# Patient Record
Sex: Female | Born: 1980 | Race: Asian | Hispanic: No | Marital: Married | Smoking: Never smoker
Health system: Southern US, Community
[De-identification: ages and names within clinical notes are randomized; demographics above are authoritative.]

## PROBLEM LIST (undated history)

## (undated) ENCOUNTER — Inpatient Hospital Stay (HOSPITAL_COMMUNITY): Payer: Self-pay

## (undated) DIAGNOSIS — F329 Major depressive disorder, single episode, unspecified: Secondary | ICD-10-CM

## (undated) DIAGNOSIS — O26619 Liver and biliary tract disorders in pregnancy, unspecified trimester: Secondary | ICD-10-CM

## (undated) DIAGNOSIS — O24419 Gestational diabetes mellitus in pregnancy, unspecified control: Secondary | ICD-10-CM

## (undated) DIAGNOSIS — O26649 Intrahepatic cholestasis of pregnancy, unspecified trimester: Secondary | ICD-10-CM

## (undated) DIAGNOSIS — K831 Obstruction of bile duct: Secondary | ICD-10-CM

## (undated) DIAGNOSIS — Z789 Other specified health status: Secondary | ICD-10-CM

## (undated) HISTORY — PX: NO PAST SURGERIES: SHX2092

## (undated) HISTORY — DX: Gestational diabetes mellitus in pregnancy, unspecified control: O24.419

## (undated) HISTORY — DX: Other specified health status: Z78.9

## (undated) HISTORY — DX: Major depressive disorder, single episode, unspecified: F32.9

---

## 2010-10-06 ENCOUNTER — Other Ambulatory Visit: Payer: Self-pay

## 2010-10-06 DIAGNOSIS — Z331 Pregnant state, incidental: Secondary | ICD-10-CM

## 2010-10-06 NOTE — Progress Notes (Signed)
Prenatal labs done [facility] Naina Sleeper

## 2010-10-07 LAB — OBSTETRIC PANEL
Eosinophils Absolute: 0.2 10*3/uL (ref 0.0–0.7)
Eosinophils Relative: 2 % (ref 0–5)
Hemoglobin: 12.8 g/dL (ref 12.0–15.0)
Hepatitis B Surface Ag: NEGATIVE
Lymphocytes Relative: 29 % (ref 12–46)
Lymphs Abs: 2.7 10*3/uL (ref 0.7–4.0)
MCH: 28.4 pg (ref 26.0–34.0)
MCV: 88.2 fL (ref 78.0–100.0)
Monocytes Relative: 5 % (ref 3–12)
Platelets: 256 10*3/uL (ref 150–400)
RBC: 4.51 MIL/uL (ref 3.87–5.11)
Rh Type: POSITIVE
Rubella: 163.3 IU/mL — ABNORMAL HIGH
WBC: 9.5 10*3/uL (ref 4.0–10.5)

## 2010-10-07 LAB — SICKLE CELL SCREEN: Sickle Cell Screen: NEGATIVE

## 2010-10-08 LAB — CULTURE, OB URINE

## 2010-10-13 ENCOUNTER — Encounter: Payer: Self-pay | Admitting: Family Medicine

## 2010-10-15 ENCOUNTER — Ambulatory Visit (INDEPENDENT_AMBULATORY_CARE_PROVIDER_SITE_OTHER): Payer: Self-pay | Admitting: Family Medicine

## 2010-10-15 ENCOUNTER — Encounter: Payer: Self-pay | Admitting: Family Medicine

## 2010-10-15 DIAGNOSIS — Z3480 Encounter for supervision of other normal pregnancy, unspecified trimester: Secondary | ICD-10-CM

## 2010-10-15 NOTE — Progress Notes (Signed)
Pt is a 30 y.o. G1P0 at 11.2 weeks based on LMP of 07/28/10-  Pt states that menstrual cycles are reliable.  S: Pt states that her only concern is that she is anxious about the pelvic exam and wasn't prepared to have this done today.  She has never had a pelvic exam previously. Asks if we can defer this to next appt.  O: see above. Lungs- CTA bilateral, good air movement Heart- RRR, no murmur, rubs or gallops Ext- no edema abd- unable to obtain FHR  A and P:  1) Unable to obtain FHR with dopplar at today's exam.  Will have pt return in 2 weeks for recheck.  2) Pt also needs to have recheck of u/a and culture since the results came back that it was most likely contaminated.  Will recheck this in 2 weeks as well.  3)pelvic exam:She will return in 2 weeks for this exam.  Refused to have this done today.   Explained the pelvic exam and showed pt speculum and the reason for the tests that we do.   4)reviewed prenatal labs- other than urine as stated above- all wnl.

## 2010-10-15 NOTE — Patient Instructions (Signed)
Set up an appointment for in 2 weeks for recheck to see if we can hear the baby's heart beat.  Also at your 2 week appointment we will do the pelvic exam.  If any questions or concerns call the office.   Talk to deborah hill about orange card so I can order u/s if it is needed. Tylenol as needed for back pain. It was so great to meet you today!  Please let me know if any questions.

## 2010-10-16 ENCOUNTER — Ambulatory Visit (INDEPENDENT_AMBULATORY_CARE_PROVIDER_SITE_OTHER): Payer: Self-pay | Admitting: Family Medicine

## 2010-10-16 VITALS — BP 101/65 | Wt 135.0 lb

## 2010-10-16 DIAGNOSIS — O3680X0 Pregnancy with inconclusive fetal viability, not applicable or unspecified: Secondary | ICD-10-CM

## 2010-10-16 NOTE — Progress Notes (Signed)
S: Pt here today for an U/S as FHTs could not be attained at last visit with Dr. Edmonia James. Pt is [redacted]w[redacted]d today, if using EDD of 11/5, which is based on LMP. Pt applying for orange card and does not want to get a dating U/S until this has been approved because of the expense. O: BP 101/65, Wt 135 No FHTs heard via doppler.  No yolk sac or fetus viewable by clinic U/S  A/P: WIll draw B-hCG today and have pt come back on Monday for another lab draw to trend values.  Offered pt to get U/S at [facility] this week, however pt would like to wait until she has [employer], so suggested getting [employer] ASAP so that better U/S can be done at [employer] for dating/viability of pregnancy/ rule out ectopic. [facility] or [facility]. Pt advised of possible options of not hearing FHTs or seeing fetus on U/S include inaccurate dating (pt earlier than stated dates) vs. Ectopic pregnancy vs. Missed SAB.

## 2010-10-16 NOTE — Progress Notes (Addendum)
Note reviewed.  Pt actually eligible for group care, but did not realize appt was this week. She will try to come today, but otherwise she states she is unable to return to clinic until May 15th. It is concerning that we cannot hear FHT at 11 weeks on this pt.  We will try to have her return in the next week or so if she cannot come today so we can retry the doppler and our ultrasound.  If no fetal heart beat is visualized, then we will need to schedule ultrasound at [facility], even if she does not have orange card.  Pt agrees with the plan. Labs reviewed.  Pelvic exam and pap, gc/cz to be completed when pt returns.

## 2010-10-16 NOTE — Patient Instructions (Signed)
I am hopeful that everything will be fine with the pregnancy. If you have bleeding or bad pain over the weekend, then please go to [facility] to be seen. Please make an appointment with the lab for Monday.

## 2010-10-20 ENCOUNTER — Other Ambulatory Visit: Payer: Self-pay

## 2010-10-20 DIAGNOSIS — O3680X0 Pregnancy with inconclusive fetal viability, not applicable or unspecified: Secondary | ICD-10-CM

## 2010-10-20 NOTE — Progress Notes (Signed)
B-HCG Jacqueline Bond

## 2010-10-21 ENCOUNTER — Telehealth: Payer: Self-pay | Admitting: Family Medicine

## 2010-10-21 DIAGNOSIS — O200 Threatened abortion: Secondary | ICD-10-CM | POA: Insufficient documentation

## 2010-10-21 LAB — HCG, QUANTITATIVE, PREGNANCY: hCG, Beta Chain, Quant, S: 30445.2 m[IU]/mL

## 2010-10-21 NOTE — Telephone Encounter (Signed)
Attempted to call pt about lab results.  Call went to VM.  No message left.  If pt calls back today, feel free to page me, even if it is the afternoon and I am not here. [phone]. Her lab results are concerning for a non-viable pregnancy.  We need an ultrasound to confirm.  I placed an order for this, but definitely want to talk with the pt before we tell her about the ultrasound appt.

## 2010-10-21 NOTE — Telephone Encounter (Signed)
Again attempted to call pt.  No answer.

## 2010-10-22 ENCOUNTER — Telehealth: Payer: Self-pay | Admitting: Family Medicine

## 2010-10-22 NOTE — Telephone Encounter (Signed)
Spoke with D. [place] and she states that patient does not want to get [place]

## 2010-10-22 NOTE — Telephone Encounter (Signed)
Spoke with pt.  She was aware of Thrusday's b hcg level, and we discussed Monday's level, which was lower.  Ms. Smeltz has spoken with several physicians in her family, and they have advised her to just wait.  Her husband is very busy with finals now.  She is concerned about the pregnancy, and we discussed that the decrease in her b-hcg is very concerning, and we discussed the possibility of ectopic pregnancy, which could lead to internal bleeding and rarely, death of the pregnant woman.  Also, we discussed the possiblity ofmaternal  infection if this is a missed AB.  Ms. Koning denies any cramping, bleeding or pain or fevers.  She states understanding of the risk of waiting, and prefers to wait.  She declines an ultrasound at this time.  She knows to go the emergency room at [facility] if she has any bleeding, fevers, cramping or anything else concerning.  She will contact us with any questions.

## 2010-11-09 ENCOUNTER — Telehealth: Payer: Self-pay | Admitting: Family Medicine

## 2010-11-09 NOTE — Telephone Encounter (Signed)
Patient is [redacted] weeks pregnant.  Noticed several episodes of spotting today when she went to the restroom.   No abdominal pain.  No nausea or vomiting.  Had intercourse on Friday, which was somewhat painful for her.   Recommended she go be seen at [facility].  Patient hesitant as she has appt on Tuesday with Dawn, but again I reiterated that she needed to be seen before then.  Patient agreed with plan.

## 2010-11-10 ENCOUNTER — Ambulatory Visit (HOSPITAL_COMMUNITY)
Admission: RE | Admit: 2010-11-10 | Discharge: 2010-11-10 | Disposition: A | Discharge status: Home / Self Care | Payer: Self-pay | Source: Ambulatory Visit | Attending: Family Medicine | Admitting: Family Medicine

## 2010-11-10 ENCOUNTER — Other Ambulatory Visit: Payer: Self-pay | Admitting: Family Medicine

## 2010-11-10 ENCOUNTER — Ambulatory Visit (INDEPENDENT_AMBULATORY_CARE_PROVIDER_SITE_OTHER): Payer: Self-pay | Admitting: Family Medicine

## 2010-11-10 ENCOUNTER — Encounter: Payer: Self-pay | Admitting: Family Medicine

## 2010-11-10 DIAGNOSIS — O200 Threatened abortion: Secondary | ICD-10-CM

## 2010-11-10 DIAGNOSIS — O368390 Maternal care for abnormalities of the fetal heart rate or rhythm, unspecified trimester, not applicable or unspecified: Secondary | ICD-10-CM | POA: Insufficient documentation

## 2010-11-10 DIAGNOSIS — O209 Hemorrhage in early pregnancy, unspecified: Secondary | ICD-10-CM | POA: Insufficient documentation

## 2010-11-10 DIAGNOSIS — Z3689 Encounter for other specified antenatal screening: Secondary | ICD-10-CM | POA: Insufficient documentation

## 2010-11-10 NOTE — Progress Notes (Signed)
  Subjective:    Patient ID: Jacqueline Bond, female    DOB: 04/17/1981, 30 y.o.   MRN: [id number]  HPI  1. Vaginal Bleed: G1P0 at 15 weeks per LMP presenting with scant vaginal bleeding x 3 days after sexual intercourse. Hx reviewed. Was seen on 4/19 - no FHT heard (11.[redacted] weeks GA). HCG = [id number]. Taken again on 4/23 = [id number]. Patient informed of need for [place] but did not go 2/2 lack of insurance. "I made my decision to wait." Taking PNV.   Review of Systems SEE HPI. No abdominal pain, vaginal DC, fever/chills, N/V/D, back pain, dizziness.   Objective:   Physical Exam  Vitals reviewed. Constitutional: She appears well-developed and well-nourished. No distress.  Neck: No thyromegaly present.  Abdominal: Soft. Bowel sounds are normal. She exhibits no distension and no mass. There is no tenderness. There is no rebound and no guarding.  Genitourinary: Uterus normal. Vaginal discharge found.       Blood and mucous (? POC) but unclear. No s/s of infection. No samples taken. No cervical motion tenderness. No mass palpated on ovaries. [place] - No FH visualized, no fetus visualized either. NOTE: This was patient's first pelvic and she was very nervous/tense, making exam difficult.     Assessment & Plan:

## 2010-11-10 NOTE — Patient Instructions (Signed)
We are checking labs today. We are sending you for an ultrasound. Please keep your appointment with Dr. Edmonia James.

## 2010-11-10 NOTE — Assessment & Plan Note (Signed)
Discussed importance of immediate [place]. Also ordered BHCG. Patient endorsed understanding and was sent directly to [facility]. US showed blighted ovum. I called the [facility] and spoke with Dr. Shawnie Pons re: further intervention (D&C vs medication). The OB/GYN staff were already aware of the patient and discussing options with the patient. I thanked Dr. Shawnie Pons and told her to let us know if she needed anything else from [place]. NOTE: We have no documented blood type.

## 2010-11-11 ENCOUNTER — Telehealth: Payer: Self-pay | Admitting: Family Medicine

## 2010-11-11 ENCOUNTER — Ambulatory Visit (INDEPENDENT_AMBULATORY_CARE_PROVIDER_SITE_OTHER): Payer: Self-pay | Admitting: Family Medicine

## 2010-11-11 DIAGNOSIS — O200 Threatened abortion: Secondary | ICD-10-CM

## 2010-11-11 LAB — HCG, QUANTITATIVE, PREGNANCY: hCG, Beta Chain, Quant, S: 5015.5 m[IU]/mL

## 2010-11-11 NOTE — Patient Instructions (Signed)
Place 4 tablets of cytotec in vagina x 1 to help the uterus contract and clean out the uterus. You will have vaginal bleeding and cramping. Take vicodin as needed for pain. If you have fever, or if you are bleeding so much that you need to change your pad every hour please go to the emergency room at [facility] hospital.  Return in 2 weeks for recheck.

## 2010-11-11 NOTE — Progress Notes (Signed)
  Subjective:    Patient ID: Jacqueline Bond, female    DOB: [birth year], 30 y.o.   MRN: [id number]  HPI Requested that pt return to see me today after my converstation with Attending Dr. Penne Lash.   Dr. Penne Lash recommended that pt be given cytotec and if she failed medical management to go forward possibly with D and C.  Called pt to discuss over phone but pt and husband had a lot of questions, and since pt's second language is english I thought that it would be best to have pt return for a work in appt to discuss in detail the medical management plan.    Review of Systems     Objective:   Physical Exam General: no acute distress.   resp: no resp distress, normal resp rate. Psych: tearful during exam.     Assessment & Plan:  After discussion of medical treatment plan (see pt instructions) pt agrees to medical management as recommended by Dr. Penne Lash.  Pt to take cytotec tonight.   vicodin for pain.  Gave red flags for return.  Pt to return in 2 weeks for recheck urine pregnancy test to ensure that it is negative.

## 2010-11-11 NOTE — Progress Notes (Signed)
S: Pt here to discuss options for anembryonic gestation.  Discussed options with Dr. Natale Milch yesterday.  Here to discuss these options further with me her pcp before making a decision.  Aware that there are 3 options 1) watchful waiting.  2) medication  3) d and c.  Pt states she is leaning toward D and C and is requesting to discuss risks and benefits.  Pt states that she is concerned about how to pay for the d and c since she has not yet talked with deborah hill.    O:   Pulm- no resp distress, symmetric breathing pattern Ext- no lower ext edema Psych- tearful during conversation  A and P: After discussion of pros and cons of anembryonic gestation and review of note from Dr. Natale Milch with pt,  Pt has decided that she would like d and C.  [facility]ap.  Pt is to go talk to deborah hill to discuss financial concerns.

## 2010-11-11 NOTE — Telephone Encounter (Signed)
Patient was seen by Dr Edmonia James this afternoon.Nozomi Mettler, Rodena Medin

## 2010-11-11 NOTE — Telephone Encounter (Signed)
Pt asking to speak with MD re: some complications she is having, did not want to give details.

## 2010-11-11 NOTE — Progress Notes (Signed)
Addended byEllin Mayhew on: 11/11/2010 06:25 PM   Modules accepted: Orders

## 2010-11-13 NOTE — Progress Notes (Signed)
Addended byEllin Mayhew on: 11/13/2010 07:33 AM   Modules accepted: Orders

## 2010-11-17 ENCOUNTER — Encounter: Payer: Self-pay | Admitting: Family Medicine

## 2010-11-20 ENCOUNTER — Encounter: Payer: Self-pay | Admitting: Family Medicine

## 2010-11-20 ENCOUNTER — Ambulatory Visit (INDEPENDENT_AMBULATORY_CARE_PROVIDER_SITE_OTHER): Payer: Self-pay | Admitting: Family Medicine

## 2010-11-20 DIAGNOSIS — O200 Threatened abortion: Secondary | ICD-10-CM

## 2010-11-20 NOTE — Assessment & Plan Note (Addendum)
+   pregnancy test 9 days after medical management with cytotec for anembryonic pregnancy.  Pt returned earlier than instructed for recheck of urine. (8 days versus 2 weeks)  Since bleeding stopped and cramping stopped most likely abortion complete.  Will trend down BHCG to ensure that complete evacuation of pregnancy. Encouraged pt to exercise as she would like.  Pt also told that she can travel to see family.

## 2010-11-20 NOTE — Progress Notes (Signed)
  Subjective:    Patient ID: Jacqueline Bond, female    DOB: 1980-07-10, 30 y.o.   MRN: [id number]  HPI Pt here for f/up s/p medical management with cytotec for anembryonic pregnancy: Pt took cytotec as directed, had severe cramping with bleeding x 12 hours.  Then cramping improved and had spotting x 6 days.  Currently pt states that no further cramping or bleeding.  No fever. No chills.  Pt wants to travel and start exercising and want to know if this is ok to start  Symptoms of depression: Since anembryonic pregnancy pt has had symptoms of depression.  +trouble sleeping. Decrease in interest/ concentration.  Decreased energy.  Normal appetite. No si or hi.     Review of Systems As per above    Objective:   Physical Exam  Constitutional: She is oriented to person, place, and time. She appears well-developed and well-nourished.  Eyes: Right eye exhibits no discharge. Left eye exhibits no discharge.  Cardiovascular: Normal rate.   Pulmonary/Chest: Effort normal and breath sounds normal. No respiratory distress.  Abdominal: Soft. There is no tenderness.  Neurological: She is alert and oriented to person, place, and time.  Skin: No rash noted.  Psychiatric: Her behavior is normal. Judgment and thought content normal.       Some tearfulness during exam/discussion          Assessment & Plan:

## 2010-11-21 ENCOUNTER — Telehealth: Payer: Self-pay | Admitting: Family Medicine

## 2010-11-21 NOTE — Telephone Encounter (Signed)
Pt requesting lab results from visit yesterday

## 2010-11-21 NOTE — Telephone Encounter (Signed)
Will fwd. To PCP 

## 2010-11-22 NOTE — Telephone Encounter (Signed)
Attempt to call pt.  No answer. Message left.

## 2010-11-25 ENCOUNTER — Other Ambulatory Visit: Payer: Self-pay | Admitting: Family Medicine

## 2010-11-25 DIAGNOSIS — O020 Blighted ovum and nonhydatidiform mole: Secondary | ICD-10-CM

## 2010-11-25 NOTE — Telephone Encounter (Signed)
Pt returning MD's call.

## 2010-11-25 NOTE — Telephone Encounter (Signed)
Spoke with pt over telephone to discuss results.  Pt states understanding.  Pt to return on 6/4 for repeat BHCG.  Pt to call for lab appointment.  Future order placed.

## 2010-11-27 ENCOUNTER — Other Ambulatory Visit: Payer: Self-pay

## 2010-12-01 ENCOUNTER — Other Ambulatory Visit: Payer: Self-pay

## 2010-12-01 DIAGNOSIS — O020 Blighted ovum and nonhydatidiform mole: Secondary | ICD-10-CM

## 2010-12-01 LAB — HCG, QUANTITATIVE, PREGNANCY: hCG, Beta Chain, Quant, S: 4 m[IU]/mL

## 2010-12-01 NOTE — Progress Notes (Signed)
b-hcg done today [facility] Oveta Idris

## 2010-12-03 ENCOUNTER — Telehealth: Payer: Self-pay | Admitting: Family Medicine

## 2010-12-03 ENCOUNTER — Encounter: Payer: Self-pay | Admitting: Family Medicine

## 2010-12-03 NOTE — Telephone Encounter (Signed)
I called to notify pt of Quant HCG now down to normal.  No answer.  Left message. Told pt to call back if she would like to talk in more detail.

## 2010-12-03 NOTE — Telephone Encounter (Signed)
Message copied by Kristen Cardinal on Wed Dec 03, 2010  8:40 AM ------      Message from: Zachery Dauer      Created: Tue Dec 02, 2010  8:45 AM      Regarding: See beta Hcg       See result, came to me for some reason

## 2011-08-13 ENCOUNTER — Encounter (HOSPITAL_COMMUNITY): Payer: Self-pay | Admitting: *Deleted

## 2012-11-01 ENCOUNTER — Ambulatory Visit (INDEPENDENT_AMBULATORY_CARE_PROVIDER_SITE_OTHER): Payer: Self-pay | Admitting: Family Medicine

## 2012-11-01 ENCOUNTER — Encounter: Payer: Self-pay | Admitting: Family Medicine

## 2012-11-01 VITALS — BP 102/66 | HR 77 | Ht 62.0 in | Wt 125.7 lb

## 2012-11-01 DIAGNOSIS — N970 Female infertility associated with anovulation: Secondary | ICD-10-CM

## 2012-11-01 DIAGNOSIS — Z3169 Encounter for other general counseling and advice on procreation: Secondary | ICD-10-CM

## 2012-11-01 LAB — COMPREHENSIVE METABOLIC PANEL
AST: 19 U/L (ref 0–37)
Albumin: 4.6 g/dL (ref 3.5–5.2)
BUN: 9 mg/dL (ref 6–23)
CO2: 25 mEq/L (ref 19–32)
Calcium: 10.1 mg/dL (ref 8.4–10.5)
Chloride: 104 mEq/L (ref 96–112)
Creat: 0.68 mg/dL (ref 0.50–1.10)
Potassium: 4.2 mEq/L (ref 3.5–5.3)

## 2012-11-01 LAB — CBC
HCT: 38.2 % (ref 36.0–46.0)
Hemoglobin: 12.8 g/dL (ref 12.0–15.0)
MCV: 85.3 fL (ref 78.0–100.0)
RBC: 4.48 MIL/uL (ref 3.87–5.11)
WBC: 7.9 10*3/uL (ref 4.0–10.5)

## 2012-11-01 NOTE — Patient Instructions (Signed)
I will let you know the results of the test. Also, please keep a record of your period. Please return in approximately 8 weeks to see how the process is progress. In the meantime, please start taking a daily pre-natal vitamin.   Take Care,   Dr. Clinton Sawyer

## 2012-11-01 NOTE — Progress Notes (Signed)
  Subjective:    Patient ID: Jacqueline Bond, female    DOB: 1981-04-05, 32 y.o.   MRN: [id number]  HPI  Accompanied by her husband   Chief Complaint: Infertility  Patient and husband trying to conceive for 6-7 months.  LMP April 13th, lasted 3-4 days; No health changes aside from unintentional weight gain of 10lbs in 3 mos; not taking any medications; Husband denies any changes to his health as well. Neither taking any medication and not with any known chronic diseases.   OB History - Spontaneous in May 2012 at [redacted] weeks EGA due to anembryonic pregnancy; This was treated with cytotec at home. Neither patient nor husband ever had child with other partners and neither with previous work up for infertility.   Past Surgery History - None for patient or husband  Fam Hx - Maternal Aunt with 6 miscarriages   Review of Systems Negative for skin changes, hair loss, cold intolerance, increased hair growth    Objective:   Physical Exam BP 102/66  Pulse 77  Ht 5\' 2"  (1.575 m)  Wt 125 lb 11.2 oz (57.017 kg)  BMI 22.98 kg/m2  LMP 10/09/2012  Breastfeeding? No  Gen: young female, well appearing, NAD, pleasant and conversant HEENT: NCAT, PERRLA, EOMI, OP clear and moist, no lymphadenopathy, no thyroid tenderness, enlargement, or nodules CV: RRR, no m/r/g, no JVD or carotid bruits Pulm: normal WOB, CTA-B Abd: soft, NDNT, NABS Extremities: no edema or joint tenderness Skin: warm, dry, no rashes, no hirsutism       Assessment & Plan:

## 2012-11-02 ENCOUNTER — Encounter: Payer: Self-pay | Admitting: Family Medicine

## 2012-11-02 DIAGNOSIS — Z3169 Encounter for other general counseling and advice on procreation: Secondary | ICD-10-CM | POA: Insufficient documentation

## 2012-11-02 LAB — TSH: TSH: 1.766 u[IU]/mL (ref 0.350–4.500)

## 2012-11-02 NOTE — Assessment & Plan Note (Signed)
Patient and husband told that work up for fertility begins usually after 12 months of trying to conceive. I noted that is appropriate to check basic labs today (CBC, BMET, TSH) on patient, but nothing else is indicated. They are agreeable to this. Will return in 2 months for update. Work up would begin with analysis of female semen.

## 2013-02-09 ENCOUNTER — Ambulatory Visit (INDEPENDENT_AMBULATORY_CARE_PROVIDER_SITE_OTHER): Payer: No Typology Code available for payment source | Admitting: Family Medicine

## 2013-02-09 ENCOUNTER — Encounter: Payer: Self-pay | Admitting: Family Medicine

## 2013-02-09 VITALS — BP 102/70 | HR 73 | Ht 62.0 in | Wt 130.0 lb

## 2013-02-09 DIAGNOSIS — Z3169 Encounter for other general counseling and advice on procreation: Secondary | ICD-10-CM

## 2013-02-09 NOTE — Progress Notes (Signed)
  Subjective:    Patient ID: Jacqueline Bond, female    DOB: [birth year], 32 y.o.   MRN: [id number]  HPI  32 year old F who presents with her husband for eval of difficulty with conceiving a child. The couple was seen in May 2014 and had been trying for 6-7 months without success. It was determined through history that both were healthy, she was having regular menstrual cycles, and there were no known secondary causes of infertility. CBC, BMP, and TSH were all WNL.   Today, the patient notes LMP on 01/29/13 with a regular 27-30 days cycle. She also notes no changes to diet or medication. Same for her husband. They have NOT had regular intercourse, with only once in the last month due to his school and her work schedule. She recognizes that this is an issue. She is also wondering if the cytotec she took for her anembryonic pregnancy in 2012 could have caused a problem. I assured her that it is not causing a problem.       Review of Systems     Objective:   Physical Exam  BP 102/70  Pulse 73  Ht 5\' 2"  (1.575 m)  Wt 130 lb (58.968 kg)  BMI 23.77 kg/m2  LMP 01/29/2013 Gen: well appearing female, normal body habitus, without virilization, pleasant and conversant  GYN exam deferred     Assessment & Plan:  > 15 minutes spent counseling the patient and her husband

## 2013-02-09 NOTE — Patient Instructions (Signed)
Thank you for coming in to see me today. I am glad that you are both still very health and excited about your husband's program. Please do the following things:  1. Have regular sex during the likely time of ovulation. You can get a kit in the pharmacy to help you measure this.  2. Have your husband establish as a patient.  3. Be patient and positive! Take a weekend vacation if needed.   Come back in 2-3 months.   Sincerely,  Dr. Clinton Sawyer

## 2013-02-10 NOTE — Assessment & Plan Note (Signed)
Assessment: Healthy well appearing patient concerning for difficulty conceiving. No concerning PMH, PSH, medications, or recreational drug use. According to her husband, he also does not have any noteworthy  PMH, PSH, medications, or recreational drug use. According to her husband, he also does not have any noteworthy. Previous work-up included basic lab work with CBC, CMP, and TSH in May 2014. All of these were normal. The couple has not been having regular intercourse, so there is no utility in progressing with a work-up until this occurs.  Plan: I counseled the couple on need for regular intercourse during the time of ovulation. I also discussed the most likely time in a cycle when she would be ovulating. The patient will obtain an OTC ovulation kit and plan for more regular sex. She will continue pre-natal vitamins. The husband will also register as a patient of mine. The first step in a true infertility work-up would be a GYN exam of the patient and genital exam of her husband. He would also need a semen analysis.

## 2013-05-03 ENCOUNTER — Ambulatory Visit: Payer: No Typology Code available for payment source | Admitting: Family Medicine

## 2013-05-10 ENCOUNTER — Ambulatory Visit (INDEPENDENT_AMBULATORY_CARE_PROVIDER_SITE_OTHER): Payer: No Typology Code available for payment source | Admitting: Family Medicine

## 2013-05-10 ENCOUNTER — Encounter: Payer: Self-pay | Admitting: Family Medicine

## 2013-05-10 ENCOUNTER — Ambulatory Visit: Payer: No Typology Code available for payment source

## 2013-05-10 VITALS — BP 101/70 | HR 83 | Temp 98.2°F | Ht 62.0 in | Wt 134.0 lb

## 2013-05-10 DIAGNOSIS — Z23 Encounter for immunization: Secondary | ICD-10-CM

## 2013-05-10 DIAGNOSIS — N926 Irregular menstruation, unspecified: Secondary | ICD-10-CM

## 2013-05-10 DIAGNOSIS — Z3201 Encounter for pregnancy test, result positive: Secondary | ICD-10-CM

## 2013-05-10 NOTE — Assessment & Plan Note (Signed)
Assessment: positive pregnancy test, per LMP [redacted]w[redacted]d EGA Plan:  - Pt counseled about taking prenatal vitamins and avoiding potentially harmful substances during pregnancy - Counseled on tx for nausea - Will make referral to our OB coordinator to set up initial OB visit

## 2013-05-10 NOTE — Patient Instructions (Signed)
Dear Jacqueline Bond,   It was great to see you. I am excited that you are pregnant. Please do the following things:  1. Take a prenatal vitamin daily. This is important for brain development of the baby.  2. Avoid cigarettes, alcohol, tobacco, raw meats and cheeses, deli meats, and cat litter.  3. For nausea you can try Unisom, ginger tablets, or vitamin B6 tablets. All are sold in the pharmacy without a prescription.   I will contact our nurse coordinator to set up your prenatal care, and she will contact you to set up an appointment. Please sooner return if you have any bleeding.   Sincerely,   Dr. Clinton Sawyer

## 2013-05-10 NOTE — Progress Notes (Signed)
  Subjective:    Patient ID: Jacqueline Bond, female    DOB: Oct 09, 1980, 32 y.o.   MRN: [id number]  HPI  Chief Complaint: Difficulty with conception   Patient and husband trying to conceive for months. Work up today revealed normal CBC, CMP, and TSH as well as non-revealing history. Patient and husband were not having regular intercourse at last visit in August 2014, therefore instructed to do so and also use ovulation kit to ensure regular ovulation. Today, they report her LMP was on 03/27/13 and that she recently has a positive home pregnancy test on 05/01/13. She and her husband are excited. She reports nausea and fatigue. Not currently taking pre-natal vitamins.   OB History - Spontaneous in May 2012 at [redacted] weeks EGA due to anembryonic pregnancy; This was treated with cytotec at home. Neither patient nor husband ever had child with other partners and neither with previous work up for infertility.   Past Surgery History - None for patient or husband   Fam Hx - Maternal Aunt with 6 miscarriages    Review of Systems See HPI    Objective:   Physical Exam BP 101/70  Pulse 83  Temp(Src) 98.2 F (36.8 C) (Oral)  Ht 5\' 2"  (1.575 m)  Wt 134 lb (60.782 kg)  BMI 24.50 kg/m2  LMP 03/27/2013  Gen: young female, actively nauseas during exam, no vomiting  HEENT: NCAT, PERRLA, op clear and moist CV: RRR, no murmurs Abd: soft, NDNT     Assessment & Plan:  Patient with positive pregnancy test.

## 2013-05-16 ENCOUNTER — Encounter: Payer: Self-pay | Admitting: Family Medicine

## 2013-05-18 ENCOUNTER — Telehealth: Payer: Self-pay | Admitting: *Deleted

## 2013-05-18 NOTE — Telephone Encounter (Signed)
Spoke with patient and instructed her to take this letter that I have set up front and call Valeria at [facility] to schedule an appt, per Dr.Breen's request.   Radene Ou, CMA

## 2013-05-22 ENCOUNTER — Other Ambulatory Visit: Payer: Self-pay | Admitting: Family Medicine

## 2013-05-22 ENCOUNTER — Ambulatory Visit (INDEPENDENT_AMBULATORY_CARE_PROVIDER_SITE_OTHER): Payer: No Typology Code available for payment source | Admitting: Family Medicine

## 2013-05-22 ENCOUNTER — Ambulatory Visit (HOSPITAL_COMMUNITY)
Admission: RE | Admit: 2013-05-22 | Discharge: 2013-05-22 | Disposition: A | Discharge status: Home / Self Care | Payer: No Typology Code available for payment source | Source: Ambulatory Visit | Attending: Family Medicine | Admitting: Family Medicine

## 2013-05-22 VITALS — BP 101/76 | HR 83 | Ht 62.0 in | Wt 134.5 lb

## 2013-05-22 DIAGNOSIS — O26859 Spotting complicating pregnancy, unspecified trimester: Secondary | ICD-10-CM

## 2013-05-22 DIAGNOSIS — O26851 Spotting complicating pregnancy, first trimester: Secondary | ICD-10-CM

## 2013-05-22 DIAGNOSIS — Z3689 Encounter for other specified antenatal screening: Secondary | ICD-10-CM | POA: Insufficient documentation

## 2013-05-22 NOTE — Patient Instructions (Signed)
Dear Ms. Kruckenberg,   It was nice to see you. I am sorry that you are having the spotting and cramping. We need to check your blood level of hormone and also get an ultrasound.   I will let you know the results.   Sincerely,   Dr. Clinton Sawyer

## 2013-05-22 NOTE — Assessment & Plan Note (Signed)
A: spotting without active bleeding, but that coupled with cramps concerning for threatened abortion P: Obtain beta HCG quant and ultrasound to evaluate

## 2013-05-22 NOTE — Progress Notes (Signed)
  Subjective:    Patient ID: Jacqueline Bond, female    DOB: 03/13/1981, 32 y.o.   MRN: [id number]  HPI  32 year old F G2P010 at 8w EGA by LMP who present with spotting. It is very light and intermittent. Also, she has cramping that is light x 1 day. Otherwise no symptoms.  Patient is concerned given history of anembryonic pregnancy in 2012.   Review of Systems No fever, chills, nausea, vomiting, diarrhea    Objective:   Physical Exam BP 101/76  Pulse 83  Ht 5\' 2"  (1.575 m)  Wt 134 lb 8 oz (61.009 kg)  BMI 24.59 kg/m2  LMP 03/27/2013  Gen: well appearing, non distressed GU: > External: no lesions > Vagina: no blood in vault, thick white discharge presents > Cervix: no lesion; no mucopurulent d/c; no motion tenderness       Assessment & Plan:

## 2013-05-23 LAB — HCG, QUANTITATIVE, PREGNANCY: hCG, Beta Chain, Quant, S: 146843.3 m[IU]/mL

## 2013-05-24 ENCOUNTER — Telehealth: Payer: Self-pay | Admitting: Family Medicine

## 2013-05-24 ENCOUNTER — Encounter: Payer: Self-pay | Admitting: Family Medicine

## 2013-05-24 NOTE — Telephone Encounter (Signed)
Pt called and would like someone to call her with her lab results. jw °

## 2013-05-24 NOTE — Telephone Encounter (Signed)
Will fwd to MD.  Jacqueline Bond L, CMA  

## 2013-05-25 NOTE — Telephone Encounter (Signed)
I spoke with the patient and informed her of normal beta-hcg. I explained that bsed on all information that we have, the fetus is developing normally. I instructed her to follow up at [facility] this weekend if patient has any more bleeding.

## 2013-05-29 ENCOUNTER — Ambulatory Visit: Payer: No Typology Code available for payment source

## 2013-05-29 ENCOUNTER — Ambulatory Visit: Payer: Self-pay | Admitting: Family Medicine

## 2013-05-29 ENCOUNTER — Other Ambulatory Visit (HOSPITAL_COMMUNITY)
Admission: RE | Admit: 2013-05-29 | Discharge: 2013-05-29 | Disposition: A | Discharge status: Home / Self Care | Payer: No Typology Code available for payment source | Source: Ambulatory Visit | Attending: Family Medicine | Admitting: Family Medicine

## 2013-05-29 ENCOUNTER — Ambulatory Visit (INDEPENDENT_AMBULATORY_CARE_PROVIDER_SITE_OTHER): Payer: No Typology Code available for payment source | Admitting: Family Medicine

## 2013-05-29 ENCOUNTER — Encounter: Payer: Self-pay | Admitting: Family Medicine

## 2013-05-29 VITALS — BP 105/73 | HR 98 | Temp 98.1°F | Ht 62.0 in | Wt 132.0 lb

## 2013-05-29 DIAGNOSIS — Z113 Encounter for screening for infections with a predominantly sexual mode of transmission: Secondary | ICD-10-CM | POA: Insufficient documentation

## 2013-05-29 DIAGNOSIS — Z124 Encounter for screening for malignant neoplasm of cervix: Secondary | ICD-10-CM

## 2013-05-29 DIAGNOSIS — Z1151 Encounter for screening for human papillomavirus (HPV): Secondary | ICD-10-CM | POA: Insufficient documentation

## 2013-05-29 DIAGNOSIS — O26851 Spotting complicating pregnancy, first trimester: Secondary | ICD-10-CM

## 2013-05-29 DIAGNOSIS — O26859 Spotting complicating pregnancy, unspecified trimester: Secondary | ICD-10-CM

## 2013-05-29 DIAGNOSIS — N760 Acute vaginitis: Secondary | ICD-10-CM

## 2013-05-29 DIAGNOSIS — Z01419 Encounter for gynecological examination (general) (routine) without abnormal findings: Secondary | ICD-10-CM | POA: Insufficient documentation

## 2013-05-29 LAB — POCT WET PREP (WET MOUNT): Clue Cells Wet Prep Whiff POC: NEGATIVE

## 2013-05-29 NOTE — Patient Instructions (Signed)
I think that you bleeding is most likely due to the irritation of your cervix. Dr. Randolm Idol will call you with your lab results (including cultures which check for infection and your PAP smear results).  Have blood work drawn today and Dr. Randolm Idol will call you with your results.   Please follow up with Dr. Clinton Sawyer in 1 week.  Please [facility] an appointment with [facility].

## 2013-05-30 ENCOUNTER — Telehealth: Payer: Self-pay | Admitting: Family Medicine

## 2013-05-30 LAB — HCG, QUANTITATIVE, PREGNANCY: hCG, Beta Chain, Quant, S: 176128.9 m[IU]/mL

## 2013-05-30 NOTE — Progress Notes (Signed)
   Subjective:    Patient ID: Jacqueline Bond, female    DOB: [birth year], 32 y.o.   MRN: [id number]  HPI 32 year old G2 P0010 currently at 9 weeks and 2 days per LMP presents for followup of vaginal spotting. She was seen and evaluated approximately one week ago in office. She was sent for transvaginal ultrasound which is outlined in the objective section. She also had beta-hCG which is within normal limits at her stage of pregnancy. These results have been reviewed with the patient. Patient states that she did stop bleeding after the visit, however this morning she did note a small amount of brownish discharge, no frank red blood, denies associated abdominal pain, no fevers, no chills, no dysuria, no vaginal irritation, she denies cramping, patient is concerned that she may be having a miscarriage, during her first pregnancy she had an embryonic miscarriage   Review of Systems  Constitutional: Negative for fever, chills and fatigue.  Respiratory: Negative for cough, choking and shortness of breath.   Cardiovascular: Negative for chest pain and leg swelling.  Genitourinary: Positive for vaginal bleeding and pelvic pain. Negative for dysuria, decreased urine volume and vaginal discharge.       Objective:   Physical Exam Vitals: Reviewed General: Pleasant [place] female, accompanied by her husband, appears anxious Abdomen: Soft, nontender, bowel sounds present, Doppler ultrasound was attempted however unable to identify fetal heart tones Pelvic examination: Performed in the presence of a nurse, speculum examination identified scant amount of brown discharge in the vaginal vault, cultures were obtained including wet mount, gonorrhea, and Chlamydia. The cervix appeared irritated and friable, there was a small amount of bleeding at the 1:00 position of the cervix, no blood appeared to be coming from the cervical os, the os appeared closed, Pap smear was obtained, bimanual examination identified closed  cervix, no adnexal tenderness       Assessment & Plan:  Please see problem specific assessment and plan.

## 2013-05-30 NOTE — Telephone Encounter (Signed)
Informed patient of normal results.

## 2013-05-30 NOTE — Telephone Encounter (Signed)
Informed patient of normal lab results. Will call patient with further culture results and pap smear results.

## 2013-05-30 NOTE — Assessment & Plan Note (Signed)
At this time the patient vaginal spotting appears to not be related to an impending miscarriage. Reviewed beta hCG and transvaginal ultrasound performed in the past week which were appropriate for this stage of her pregnancy. At this time the most likely etiology of her vaginal discharge/spotting is related to a friable cervix likely from hormonal change related to pregnancy. Cultures were also obtained to rule out infection. Pap smear was obtained to rule out cervical abnormality. Will repeat beta hCG to screen for appropriate increase. Patient was reassured that at this time it does not appear that she is having a miscarriage.  Patient to followup at [facility] "so that she can be assigned to a obstetrician/family physician in [place] area for prenatal care; patient has yet to have prenatal labs obtained as she is not established care for her pregnancy, patient wishes to care for at [facility] however first must be screened through "[facility]".

## 2013-05-30 NOTE — Telephone Encounter (Signed)
Patient would like her results from lab work from yesterday OV with Dr. Randolm Idol.

## 2013-06-01 ENCOUNTER — Telehealth: Payer: Self-pay | Admitting: Family Medicine

## 2013-06-01 NOTE — Telephone Encounter (Signed)
Pt returned called

## 2013-06-02 NOTE — Telephone Encounter (Signed)
Left voice mail that culture results and pap smear negative.

## 2013-06-02 NOTE — Telephone Encounter (Signed)
Pt notified.  Russell Engelstad L, CMA  

## 2013-06-05 ENCOUNTER — Telehealth: Payer: Self-pay | Admitting: Family Medicine

## 2013-06-05 NOTE — Telephone Encounter (Signed)
Msg left for patient to call clinic to let me know if she has been to [facility] yet and what her status is.

## 2013-06-05 NOTE — Telephone Encounter (Signed)
Pt called to say [facility] appt tomorrow. Will contact us afterwards to set up appt.

## 2013-06-13 ENCOUNTER — Other Ambulatory Visit: Payer: No Typology Code available for payment source

## 2013-06-13 DIAGNOSIS — Z331 Pregnant state, incidental: Secondary | ICD-10-CM

## 2013-06-13 NOTE — Progress Notes (Signed)
OB LABS DONE TODAY Merrily Tegeler 

## 2013-06-14 ENCOUNTER — Telehealth: Payer: Self-pay | Admitting: Family Medicine

## 2013-06-14 LAB — OBSTETRIC PANEL
Basophils Absolute: 0 10*3/uL (ref 0.0–0.1)
Basophils Relative: 0 % (ref 0–1)
HCT: 38.9 % (ref 36.0–46.0)
Hemoglobin: 13 g/dL (ref 12.0–15.0)
Hepatitis B Surface Ag: NEGATIVE
Lymphocytes Relative: 20 % (ref 12–46)
MCHC: 33.4 g/dL (ref 30.0–36.0)
Neutro Abs: 8.5 10*3/uL — ABNORMAL HIGH (ref 1.7–7.7)
Neutrophils Relative %: 73 % (ref 43–77)
RDW: 14.2 % (ref 11.5–15.5)
Rubella: 6.63 Index — ABNORMAL HIGH (ref <0.90)
WBC: 11.7 10*3/uL — ABNORMAL HIGH (ref 4.0–10.5)

## 2013-06-14 NOTE — Telephone Encounter (Signed)
Pt called and reported that she is having abdominal pains and was going to go to [facility] to have it checked out. She said she was not having any discharge or any other symptoms. jw

## 2013-06-14 NOTE — Telephone Encounter (Signed)
Will fwd to PCP.  Davon Abdelaziz L, CMA  

## 2013-06-14 NOTE — Telephone Encounter (Signed)
I will f/u documentation from [facility]

## 2013-06-15 LAB — CULTURE, OB URINE
Colony Count: NO GROWTH
Organism ID, Bacteria: NO GROWTH

## 2013-06-20 ENCOUNTER — Ambulatory Visit (INDEPENDENT_AMBULATORY_CARE_PROVIDER_SITE_OTHER): Payer: No Typology Code available for payment source | Admitting: Family Medicine

## 2013-06-20 ENCOUNTER — Encounter: Payer: Self-pay | Admitting: Family Medicine

## 2013-06-20 VITALS — BP 106/66 | Temp 99.3°F | Wt 135.0 lb

## 2013-06-20 DIAGNOSIS — F32A Depression, unspecified: Secondary | ICD-10-CM

## 2013-06-20 DIAGNOSIS — Z3480 Encounter for supervision of other normal pregnancy, unspecified trimester: Secondary | ICD-10-CM

## 2013-06-20 DIAGNOSIS — Z3491 Encounter for supervision of normal pregnancy, unspecified, first trimester: Secondary | ICD-10-CM

## 2013-06-20 DIAGNOSIS — F329 Major depressive disorder, single episode, unspecified: Secondary | ICD-10-CM

## 2013-06-20 DIAGNOSIS — O0990 Supervision of high risk pregnancy, unspecified, unspecified trimester: Secondary | ICD-10-CM | POA: Insufficient documentation

## 2013-06-20 HISTORY — DX: Depression, unspecified: F32.A

## 2013-06-20 NOTE — Patient Instructions (Signed)
Come back in 4 weeks for a prenatal visit  It was great to meet you, your labs look great.  Pregnancy - First Trimester During sexual intercourse, millions of sperm go into the vagina. Only 1 sperm will penetrate and fertilize the female egg while it is in the Fallopian tube. One week later, the fertilized egg implants into the wall of the uterus. An embryo begins to develop into a baby. At 6 to 8 weeks, the eyes and face are formed and the heartbeat can be seen on ultrasound. At the end of 12 weeks (first trimester), all the baby's organs are formed. Now that you are pregnant, you will want to do everything you can to have a healthy baby. Two of the most important things are to get good prenatal care and follow your caregiver's instructions. Prenatal care is all the medical care you receive before the baby's birth. It is given to prevent, find, and treat problems during the pregnancy and childbirth. PRENATAL EXAMS  During prenatal visits, your weight, blood pressure, and urine are checked. This is done to make sure you are healthy and progressing normally during the pregnancy.  A pregnant woman should gain 25 to 35 pounds during the pregnancy. However, if you are overweight or underweight, your caregiver will advise you regarding your weight.  Your caregiver will ask and answer questions for you.  Blood work, cervical cultures, other necessary tests, and a Pap test are done during your prenatal exams. These tests are done to check on your health and the probable health of your baby. Tests are strongly recommended and done for HIV with your permission. This is the virus that causes AIDS. These tests are done because medicines can be given to help prevent your baby from being born with this infection should you have been infected without knowing it. Blood work is also used to find out your blood type, previous infections, and follow your blood levels (hemoglobin).  Low hemoglobin (anemia) is common  during pregnancy. Iron and vitamins are given to help prevent this. Later in the pregnancy, blood tests for diabetes will be done along with any other tests if any problems develop.  You may need other tests to make sure you and the baby are doing well. CHANGES DURING THE FIRST TRIMESTER  Your body goes through many changes during pregnancy. They vary from person to person. Talk to your caregiver about changes you notice and are concerned about. Changes can include:  Your menstrual period stops.  The egg and sperm carry the genes that determine what you look like. Genes from you and your partner are forming a baby. The female genes determine whether the baby is a boy or a girl.  Your body increases in girth and you may feel bloated.  Feeling sick to your stomach (nauseous) and throwing up (vomiting). If the vomiting is uncontrollable, call your caregiver.  Your breasts will begin to enlarge and become tender.  Your nipples may stick out more and become darker.  The need to urinate more. Painful urination may mean you have a bladder infection.  Tiring easily.  Loss of appetite.  Cravings for certain kinds of food.  At first, you may gain or lose a couple of pounds.  You may have changes in your emotions from day to day (excited to be pregnant or concerned something may go wrong with the pregnancy and baby).  You may have more vivid and strange dreams. HOME CARE INSTRUCTIONS   It is very  important to avoid all smoking, alcohol and non-prescribed drugs during your pregnancy. These affect the formation and growth of the baby. Avoid chemicals while pregnant to ensure the delivery of a healthy infant.  Start your prenatal visits by the 12th week of pregnancy. They are usually scheduled monthly at first, then more often in the last 2 months before delivery. Keep your caregiver's appointments. Follow your caregiver's instructions regarding medicine use, blood and lab tests, exercise, and  diet.  During pregnancy, you are providing food for you and your baby. Eat regular, well-balanced meals. Choose foods such as meat, fish, milk and other low fat dairy products, vegetables, fruits, and whole-grain breads and cereals. Your caregiver will tell you of the ideal weight gain.  You can help morning sickness by keeping soda crackers at the bedside. Eat a couple before arising in the morning. You may want to use the crackers without salt on them.  Eating 4 to 5 small meals rather than 3 large meals a day also may help the nausea and vomiting.  Drinking liquids between meals instead of during meals also seems to help nausea and vomiting.  A physical sexual relationship may be continued throughout pregnancy if there are no other problems. Problems may be early (premature) leaking of amniotic fluid from the membranes, vaginal bleeding, or belly (abdominal) pain.  Exercise regularly if there are no restrictions. Check with your caregiver or physical therapist if you are unsure of the safety of some of your exercises. Greater weight gain will occur in the last 2 trimesters of pregnancy. Exercising will help:  Control your weight.  Keep you in shape.  Prepare you for labor and delivery.  Help you lose your pregnancy weight after you deliver your baby.  Wear a good support or jogging bra for breast tenderness during pregnancy. This may help if worn during sleep too.  Ask when prenatal classes are available. Begin classes when they are offered.  Do not use hot tubs, steam rooms, or saunas.  Wear your seat belt when driving. This protects you and your baby if you are in an accident.  Avoid raw meat, uncooked cheese, cat litter boxes, and soil used by cats throughout the pregnancy. These carry germs that can cause birth defects in the baby.  The first trimester is a good time to visit your dentist for your dental health. Getting your teeth cleaned is okay. Use a softer toothbrush and  brush gently during pregnancy.  Ask for help if you have financial, counseling, or nutritional needs during pregnancy. Your caregiver will be able to offer counseling for these needs as well as refer you for other special needs.  Do not take any medicines or herbs unless told by your caregiver.  Inform your caregiver if there is any mental or physical domestic violence.  Make a list of emergency phone numbers of family, friends, hospital, and police and fire departments.  Write down your questions. Take them to your prenatal visit.  Do not douche.  Do not cross your legs.  If you have to stand for long periods of time, rotate you feet or take small steps in a circle.  You may have more vaginal secretions that may require a sanitary pad. Do not use tampons or scented sanitary pads. MEDICINES AND DRUG USE IN PREGNANCY  Take prenatal vitamins as directed. The vitamin should contain 1 milligram of folic acid. Keep all vitamins out of reach of children. Only a couple vitamins or tablets containing iron may  be fatal to a baby or young child when ingested.  Avoid use of all medicines, including herbs, over-the-counter medicines, not prescribed or suggested by your caregiver. Only take over-the-counter or prescription medicines for pain, discomfort, or fever as directed by your caregiver. Do not use aspirin, ibuprofen, or naproxen unless directed by your caregiver.  Let your caregiver also know about herbs you may be using.  Alcohol is related to a number of birth defects. This includes fetal alcohol syndrome. All alcohol, in any form, should be avoided completely. Smoking will cause low birth rate and premature babies.  Street or illegal drugs are very harmful to the baby. They are absolutely forbidden. A baby born to an addicted mother will be addicted at birth. The baby will go through the same withdrawal an adult does.  Let your caregiver know about any medicines that you have to take and  for what reason you take them. SEEK MEDICAL CARE IF:  You have any concerns or worries during your pregnancy. It is better to call with your questions if you feel they cannot wait, rather than worry about them. SEEK IMMEDIATE MEDICAL CARE IF:   An unexplained oral temperature above 102 F (38.9 C) develops, or as your caregiver suggests.  You have leaking of fluid from the vagina (birth canal). If leaking membranes are suspected, take your temperature and inform your caregiver of this when you call.  There is vaginal spotting or bleeding. Notify your caregiver of the amount and how many pads are used.  You develop a bad smelling vaginal discharge with a change in the color.  You continue to feel sick to your stomach (nauseated) and have no relief from remedies suggested. You vomit blood or coffee ground-like materials.  You lose more than 2 pounds of weight in 1 week.  You gain more than 2 pounds of weight in 1 week and you notice swelling of your face, hands, feet, or legs.  You gain 5 pounds or more in 1 week (even if you do not have swelling of your hands, face, legs, or feet).  You get exposed to [place] measles and have never had them.  You are exposed to fifth disease or chickenpox.  You develop belly (abdominal) pain. Round ligament discomfort is a common non-cancerous (benign) cause of abdominal pain in pregnancy. Your caregiver still must evaluate this.  You develop headache, fever, diarrhea, pain with urination, or shortness of breath.  You fall or are in a car accident or have any kind of trauma.  There is mental or physical violence in your home. Document Released: 06/09/2001 Document Revised: 03/09/2012 Document Reviewed: 12/11/2008 [facility] Patient Information 2014 [place], [place].

## 2013-06-20 NOTE — Progress Notes (Signed)
Jacqueline Bond is a 32 y.o. yo G2P0010 at [redacted]w[redacted]d who presents for her initial prenatal visit. Pregnancy  isplanned She reports nausea and mild abd pain She  isTaking PNV. See flow sheet for details.  PMH, POBH, FH, meds, allergies and Social Hx reviewed.  Prenatal exam: Gen: Well nourished, well developed.  No distress.  Vitals noted. HEENT: Normocephalic, atraumatic.  Neck supple without cervical lymphadenopathy, thyromegaly or thyroid nodules.  fair dentition. CV: RRR no murmur, gallops or rubs Lungs: CTA B.  Normal respiratory effort without wheezes or rales. Abd: soft, NTND. +BS.  Uterus not appreciated above pelvis. GU: deferred, had exam on 12/1 Ext: No clubbing, cyanosis or edema. Psych: Normal grooming and dress.  Not depressed or anxious appearing.  Normal thought content and process without flight of ideas or looseness of associations    Assessment/plan: 1) Pregnancy [redacted]w[redacted]d doing well.  Current pregnancy issues include Depression, dicussed option of starting SSRi with patient, she would like to monitor closely.  Dating is reliable Prenatal labs reviewed, notable for normal results. Bleeding and pain precautions reviewed. Importance of prenatal vitamins reviewed.  Genetic screening offered.  Early glucola is not indicated.    Follow up 4 weeks.

## 2013-06-20 NOTE — Assessment & Plan Note (Signed)
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  She would like to defer medication treatment for now Would consider Fluoxetine if she worsens.  Will monitor closely.

## 2013-06-26 ENCOUNTER — Ambulatory Visit (INDEPENDENT_AMBULATORY_CARE_PROVIDER_SITE_OTHER): Payer: No Typology Code available for payment source | Admitting: Emergency Medicine

## 2013-06-26 VITALS — BP 118/70 | Wt 137.0 lb

## 2013-06-26 DIAGNOSIS — K5900 Constipation, unspecified: Secondary | ICD-10-CM

## 2013-06-26 NOTE — Progress Notes (Signed)
S: 32 year old G2P0010 at 13 weeks by LMP presenting with lower abdominal pain.  She reports intermittent lower abdominal pain (more on the left) for the last 3-4 days.  It has stayed about the same.  It is worse with prolonged walking and when her stomach is empty.  It improved this morning after a BM.  She reports constipation for the last 3-4 days.  Denies any vaginal bleeding or discharge.  No dysuria or hematuria.    O:  See flowsheet Abd: +BS, soft, NTND Ultrasound: unable to appreciate FHT with doppler; cardiac activity seen on ultrasound  A/P: G2P0010 at 13 weeks with abdominal pain. -suspect mostly from constipation - discussed fluid and fiber intake -also discussed round ligament pain -tylenol prn for pain -continue prenatal vitamin -FHT reassuring -f/u as scheduled in January

## 2013-06-26 NOTE — Assessment & Plan Note (Signed)
Discussed fiber and fluid intake. Recommended OTC metamucil and/or miralax as needed.

## 2013-06-26 NOTE — Patient Instructions (Signed)
It was nice to meet you!  You pain is probably form a combination of ligament pain, abdominal wall pain, and constipation.  Eat lots of fruit and veggies to help with the constipation. You can take Metamucil, a fiber supplement if needed. If you go more that 2 days without a bowel movement, you should take some Miralax.  It is safe in pregnancy.  You can also take tylenol as needed for the discomfort.  If the pain worsens or you start having vaginal bleeding, please let us know.  Follow up as scheduled the end of January.

## 2013-06-29 NOTE — L&D Delivery Note (Signed)
Delivery Note At 10:30 PM a healthy female was delivered via Vaginal, Spontaneous Delivery (Presentation: Right Occiput Anterior).  APGAR: 9, 9; weight: pending.   Placenta status: Intact, Spontaneous.  Cord: 3 vessels with the following complications: None.  Cord pH: N/a  Anesthesia: Epidural  Episiotomy: None Lacerations: 2nd degree;Perineal Suture Repair: 3.0 vicryl Est. Blood Loss (mL): 250  Mom to postpartum.  Baby to Couplet care / Skin to Skin.  Pt pushed with good maternal effort to deliver a liveborn female via NSVD with spontaneous cry. Baby placed on maternal abdomen. Delayed cord clamping performed. Cord cut by father of baby. Placenta delivered intact with 3V cord via traction and pitocin. 2nd degree tear repaired with 3.0 vicryl. No complications. Mom and baby doing well and to go to postpartum.   Kevin FentonBradshaw, Samuel 12/22/2013, 11:14 PM  I have seen and examined this patient and I agree with the above. Jai Steil CNM 1:16 AM 12/23/2013

## 2013-07-17 ENCOUNTER — Ambulatory Visit (INDEPENDENT_AMBULATORY_CARE_PROVIDER_SITE_OTHER): Payer: No Typology Code available for payment source | Admitting: Family Medicine

## 2013-07-17 VITALS — BP 110/72 | Temp 98.1°F | Wt 139.0 lb

## 2013-07-17 DIAGNOSIS — Z3490 Encounter for supervision of normal pregnancy, unspecified, unspecified trimester: Secondary | ICD-10-CM

## 2013-07-17 DIAGNOSIS — Z3480 Encounter for supervision of other normal pregnancy, unspecified trimester: Secondary | ICD-10-CM

## 2013-07-17 NOTE — Progress Notes (Signed)
Gus HeightBidita Guardado is a 33 y.o. G2P0010 at 10851w0d for routine follow up.  She reports constipation See flow sheet for details.  A/P: Pregnancy at 7451w0d.  Doing well.   Pregnancy issues include none Anatomy ultrasound ordered to be scheduled at 18-19 weeks. Pt  is not interested in genetic screening. Bleeding and pain precautions reviewed. Recommended Metamucil and/or Benefiber for constipation, MiraLax if that doesn't help Tylenol for round ligament pain Follow up 4 weeks.

## 2013-07-17 NOTE — Patient Instructions (Addendum)
Great to see you today!  Constipation:  Continue fruits, veggies, and lots  Try metamucil, or benefiber, twice daily for 1 week Then try miralax, 1 capfull daily, increase to up to 3 capfulls daily to achieve regular BMs  Good sources of information include: [facility] and medline plus  Pregnancy - First Trimester During sexual intercourse, millions of sperm go into the vagina. Only 1 sperm will penetrate and fertilize the female egg while it is in the Fallopian tube. One week later, the fertilized egg implants into the wall of the uterus. An embryo begins to develop into a baby. At 6 to 8 weeks, the eyes and face are formed and the heartbeat can be seen on ultrasound. At the end of 12 weeks (first trimester), all the baby's organs are formed. Now that you are pregnant, you will want to do everything you can to have a healthy baby. Two of the most important things are to get good prenatal care and follow your caregiver's instructions. Prenatal care is all the medical care you receive before the baby's birth. It is given to prevent, find, and treat problems during the pregnancy and childbirth. PRENATAL EXAMS  During prenatal visits, your weight, blood pressure, and urine are checked. This is done to make sure you are healthy and progressing normally during the pregnancy.  A pregnant woman should gain 25 to 35 pounds during the pregnancy. However, if you are overweight or underweight, your caregiver will advise you regarding your weight.  Your caregiver will ask and answer questions for you.  Blood work, cervical cultures, other necessary tests, and a Pap test are done during your prenatal exams. These tests are done to check on your health and the probable health of your baby. Tests are strongly recommended and done for HIV with your permission. This is the virus that causes AIDS. These tests are done because medicines can be given to help prevent your baby from being born with this infection  should you have been infected without knowing it. Blood work is also used to find out your blood type, previous infections, and follow your blood levels (hemoglobin).  Low hemoglobin (anemia) is common during pregnancy. Iron and vitamins are given to help prevent this. Later in the pregnancy, blood tests for diabetes will be done along with any other tests if any problems develop.  You may need other tests to make sure you and the baby are doing well. CHANGES DURING THE FIRST TRIMESTER  Your body goes through many changes during pregnancy. They vary from person to person. Talk to your caregiver about changes you notice and are concerned about. Changes can include:  Your menstrual period stops.  The egg and sperm carry the genes that determine what you look like. Genes from you and your partner are forming a baby. The female genes determine whether the baby is a boy or a girl.  Your body increases in girth and you may feel bloated.  Feeling sick to your stomach (nauseous) and throwing up (vomiting). If the vomiting is uncontrollable, call your caregiver.  Your breasts will begin to enlarge and become tender.  Your nipples may stick out more and become darker.  The need to urinate more. Painful urination may mean you have a bladder infection.  Tiring easily.  Loss of appetite.  Cravings for certain kinds of food.  At first, you may gain or lose a couple of pounds.  You may have changes in your emotions from day to day (excited  to be pregnant or concerned something may go wrong with the pregnancy and baby).  You may have more vivid and strange dreams. HOME CARE INSTRUCTIONS   It is very important to avoid all smoking, alcohol and non-prescribed drugs during your pregnancy. These affect the formation and growth of the baby. Avoid chemicals while pregnant to ensure the delivery of a healthy infant.  Start your prenatal visits by the 12th week of pregnancy. They are usually scheduled  monthly at first, then more often in the last 2 months before delivery. Keep your caregiver's appointments. Follow your caregiver's instructions regarding medicine use, blood and lab tests, exercise, and diet.  During pregnancy, you are providing food for you and your baby. Eat regular, well-balanced meals. Choose foods such as meat, fish, milk and other low fat dairy products, vegetables, fruits, and whole-grain breads and cereals. Your caregiver will tell you of the ideal weight gain.  You can help morning sickness by keeping soda crackers at the bedside. Eat a couple before arising in the morning. You may want to use the crackers without salt on them.  Eating 4 to 5 small meals rather than 3 large meals a day also may help the nausea and vomiting.  Drinking liquids between meals instead of during meals also seems to help nausea and vomiting.  A physical sexual relationship may be continued throughout pregnancy if there are no other problems. Problems may be early (premature) leaking of amniotic fluid from the membranes, vaginal bleeding, or belly (abdominal) pain.  Exercise regularly if there are no restrictions. Check with your caregiver or physical therapist if you are unsure of the safety of some of your exercises. Greater weight gain will occur in the last 2 trimesters of pregnancy. Exercising will help:  Control your weight.  Keep you in shape.  Prepare you for labor and delivery.  Help you lose your pregnancy weight after you deliver your baby.  Wear a good support or jogging bra for breast tenderness during pregnancy. This may help if worn during sleep too.  Ask when prenatal classes are available. Begin classes when they are offered.  Do not use hot tubs, steam rooms, or saunas.  Wear your seat belt when driving. This protects you and your baby if you are in an accident.  Avoid raw meat, uncooked cheese, cat litter boxes, and soil used by cats throughout the pregnancy. These  carry germs that can cause birth defects in the baby.  The first trimester is a good time to visit your dentist for your dental health. Getting your teeth cleaned is okay. Use a softer toothbrush and brush gently during pregnancy.  Ask for help if you have financial, counseling, or nutritional needs during pregnancy. Your caregiver will be able to offer counseling for these needs as well as refer you for other special needs.  Do not take any medicines or herbs unless told by your caregiver.  Inform your caregiver if there is any mental or physical domestic violence.  Make a list of emergency phone numbers of family, friends, hospital, and police and fire departments.  Write down your questions. Take them to your prenatal visit.  Do not douche.  Do not cross your legs.  If you have to stand for long periods of time, rotate you feet or take small steps in a circle.  You may have more vaginal secretions that may require a sanitary pad. Do not use tampons or scented sanitary pads. MEDICINES AND DRUG USE IN PREGNANCY  Take prenatal vitamins as directed. The vitamin should contain 1 milligram of folic acid. Keep all vitamins out of reach of children. Only a couple vitamins or tablets containing iron may be fatal to a baby or young child when ingested.  Avoid use of all medicines, including herbs, over-the-counter medicines, not prescribed or suggested by your caregiver. Only take over-the-counter or prescription medicines for pain, discomfort, or fever as directed by your caregiver. Do not use aspirin, ibuprofen, or naproxen unless directed by your caregiver.  Let your caregiver also know about herbs you may be using.  Alcohol is related to a number of birth defects. This includes fetal alcohol syndrome. All alcohol, in any form, should be avoided completely. Smoking will cause low birth rate and premature babies.  Street or illegal drugs are very harmful to the baby. They are absolutely  forbidden. A baby born to an addicted mother will be addicted at birth. The baby will go through the same withdrawal an adult does.  Let your caregiver know about any medicines that you have to take and for what reason you take them. SEEK MEDICAL CARE IF:  You have any concerns or worries during your pregnancy. It is better to call with your questions if you feel they cannot wait, rather than worry about them. SEEK IMMEDIATE MEDICAL CARE IF:   An unexplained oral temperature above 102 F (38.9 C) develops, or as your caregiver suggests.  You have leaking of fluid from the vagina (birth canal). If leaking membranes are suspected, take your temperature and inform your caregiver of this when you call.  There is vaginal spotting or bleeding. Notify your caregiver of the amount and how many pads are used.  You develop a bad smelling vaginal discharge with a change in the color.  You continue to feel sick to your stomach (nauseated) and have no relief from remedies suggested. You vomit blood or coffee ground-like materials.  You lose more than 2 pounds of weight in 1 week.  You gain more than 2 pounds of weight in 1 week and you notice swelling of your face, hands, feet, or legs.  You gain 5 pounds or more in 1 week (even if you do not have swelling of your hands, face, legs, or feet).  You get exposed to [place] measles and have never had them.  You are exposed to fifth disease or chickenpox.  You develop belly (abdominal) pain. Round ligament discomfort is a common non-cancerous (benign) cause of abdominal pain in pregnancy. Your caregiver still must evaluate this.  You develop headache, fever, diarrhea, pain with urination, or shortness of breath.  You fall or are in a car accident or have any kind of trauma.  There is mental or physical violence in your home. Document Released: 06/09/2001 Document Revised: 03/09/2012 Document Reviewed: 12/11/2008 [facility] Patient Information 2014  [place], [place].

## 2013-08-07 ENCOUNTER — Ambulatory Visit (HOSPITAL_COMMUNITY)
Admission: RE | Admit: 2013-08-07 | Discharge: 2013-08-07 | Disposition: A | Discharge status: Home / Self Care | Payer: No Typology Code available for payment source | Source: Ambulatory Visit | Attending: Family Medicine | Admitting: Family Medicine

## 2013-08-07 DIAGNOSIS — Z3689 Encounter for other specified antenatal screening: Secondary | ICD-10-CM | POA: Insufficient documentation

## 2013-08-07 DIAGNOSIS — Z3490 Encounter for supervision of normal pregnancy, unspecified, unspecified trimester: Secondary | ICD-10-CM

## 2013-08-07 NOTE — Progress Notes (Signed)
MFM ultrasound  Indication: 33 yr old G2P0010 at 264w0d for fetal anatomic survey. Remote read.  Findings: 1. Single intrauterine pregnancy. 2. Fetal biometry is consistent with dating; 6 days off. 3. Anterior placenta without evidence of previa. 4. Normal amniotic fluid volume. 5. Normal transabdominal cervical length. 6. The views of the spine and heart are limited. 7. The remainder of the limited anatomy survey is normal.  Recommendations: 1. Appropriate dating: - although fetus measures 6 days behind LMP dating - recommend correlate dates 2. Limited anatomy survey: - recommend follow up in 3-4 weeks to complete anatomic survey  Jacqueline FosterKristen Shadaya Marschner, MD

## 2013-08-11 ENCOUNTER — Ambulatory Visit (INDEPENDENT_AMBULATORY_CARE_PROVIDER_SITE_OTHER): Payer: No Typology Code available for payment source | Admitting: Family Medicine

## 2013-08-11 VITALS — BP 104/71 | Temp 98.1°F | Wt 143.0 lb

## 2013-08-11 DIAGNOSIS — Z3490 Encounter for supervision of normal pregnancy, unspecified, unspecified trimester: Secondary | ICD-10-CM

## 2013-08-11 DIAGNOSIS — Z3480 Encounter for supervision of other normal pregnancy, unspecified trimester: Secondary | ICD-10-CM

## 2013-08-11 NOTE — Patient Instructions (Signed)
Come back in 4 weeks, You are doing great!  Second Trimester of Pregnancy The second trimester is from week 13 through week 28, months 4 through 6. The second trimester is often a time when you feel your best. Your body has also adjusted to being pregnant, and you begin to feel better physically. Usually, morning sickness has lessened or quit completely, you may have more energy, and you may have an increase in appetite. The second trimester is also a time when the fetus is growing rapidly. At the end of the sixth month, the fetus is about 9 inches long and weighs about 1 pounds. You will likely begin to feel the baby move (quickening) between 18 and 20 weeks of the pregnancy. BODY CHANGES Your body goes through many changes during pregnancy. The changes vary from woman to woman.   Your weight will continue to increase. You will notice your lower abdomen bulging out.  You may begin to get stretch marks on your hips, abdomen, and breasts.  You may develop headaches that can be relieved by medicines approved by your caregiver.  You may urinate more often because the fetus is pressing on your bladder.  You may develop or continue to have heartburn as a result of your pregnancy.  You may develop constipation because certain hormones are causing the muscles that push waste through your intestines to slow down.  You may develop hemorrhoids or swollen, bulging veins (varicose veins).  You may have back pain because of the weight gain and pregnancy hormones relaxing your joints between the bones in your pelvis and as a result of a shift in weight and the muscles that support your balance.  Your breasts will continue to grow and be tender.  Your gums may bleed and may be sensitive to brushing and flossing.  Dark spots or blotches (chloasma, mask of pregnancy) may develop on your face. This will likely fade after the baby is born.  A dark line from your belly button to the pubic area (linea  nigra) may appear. This will likely fade after the baby is born. WHAT TO EXPECT AT YOUR PRENATAL VISITS During a routine prenatal visit:  You will be weighed to make sure you and the fetus are growing normally.  Your blood pressure will be taken.  Your abdomen will be measured to track your baby's growth.  The fetal heartbeat will be listened to.  Any test results from the previous visit will be discussed. Your caregiver may ask you:  How you are feeling.  If you are feeling the baby move.  If you have had any abnormal symptoms, such as leaking fluid, bleeding, severe headaches, or abdominal cramping.  If you have any questions. Other tests that may be performed during your second trimester include:  Blood tests that check for:  Low iron levels (anemia).  Gestational diabetes (between 24 and 28 weeks).  Rh antibodies.  Urine tests to check for infections, diabetes, or protein in the urine.  An ultrasound to confirm the proper growth and development of the baby.  An amniocentesis to check for possible genetic problems.  Fetal screens for spina bifida and Down syndrome. HOME CARE INSTRUCTIONS   Avoid all smoking, herbs, alcohol, and unprescribed drugs. These chemicals affect the formation and growth of the baby.  Follow your caregiver's instructions regarding medicine use. There are medicines that are either safe or unsafe to take during pregnancy.  Exercise only as directed by your caregiver. Experiencing uterine cramps is a  good sign to stop exercising.  Continue to eat regular, healthy meals.  Wear a good support bra for breast tenderness.  Do not use hot tubs, steam rooms, or saunas.  Wear your seat belt at all times when driving.  Avoid raw meat, uncooked cheese, cat litter boxes, and soil used by cats. These carry germs that can cause birth defects in the baby.  Take your prenatal vitamins.  Try taking a stool softener (if your caregiver approves) if you  develop constipation. Eat more high-fiber foods, such as fresh vegetables or fruit and whole grains. Drink plenty of fluids to keep your urine clear or pale yellow.  Take warm sitz baths to soothe any pain or discomfort caused by hemorrhoids. Use hemorrhoid cream if your caregiver approves.  If you develop varicose veins, wear support hose. Elevate your feet for 15 minutes, 3 4 times a day. Limit salt in your diet.  Avoid heavy lifting, wear low heel shoes, and practice good posture.  Rest with your legs elevated if you have leg cramps or low back pain.  Visit your dentist if you have not gone yet during your pregnancy. Use a soft toothbrush to brush your teeth and be gentle when you floss.  A sexual relationship may be continued unless your caregiver directs you otherwise.  Continue to go to all your prenatal visits as directed by your caregiver. SEEK MEDICAL CARE IF:   You have dizziness.  You have mild pelvic cramps, pelvic pressure, or nagging pain in the abdominal area.  You have persistent nausea, vomiting, or diarrhea.  You have a bad smelling vaginal discharge.  You have pain with urination. SEEK IMMEDIATE MEDICAL CARE IF:   You have a fever.  You are leaking fluid from your vagina.  You have spotting or bleeding from your vagina.  You have severe abdominal cramping or pain.  You have rapid weight gain or loss.  You have shortness of breath with chest pain.  You notice sudden or extreme swelling of your face, hands, ankles, feet, or legs.  You have not felt your baby move in over an hour.  You have severe headaches that do not go away with medicine.  You have vision changes. Document Released: 06/09/2001 Document Revised: 02/15/2013 Document Reviewed: 08/16/2012 [facility] Patient Information 2014 [place], [place].

## 2013-08-11 NOTE — Progress Notes (Signed)
[name] is a 33 y.o. G2P0010 at 7375w4d for routine follow up.  She reports improved constipation with metamucil, Some heartburn.   See flow sheet for details.  A/P: Pregnancy at 6875w4d.  Doing well.   Pregnancy issues include None Anatomy scan reviewed, problems are noted. Female, no abnormalities, inadequate views repeat US in 4 weeks, will order next visit.  kick counts reviewed. Follow up 4 weeks.

## 2013-08-28 ENCOUNTER — Inpatient Hospital Stay (HOSPITAL_COMMUNITY)
Admission: AD | Admit: 2013-08-28 | Discharge: 2013-08-28 | Disposition: A | Discharge status: Home / Self Care | Payer: No Typology Code available for payment source | Source: Ambulatory Visit | Attending: Obstetrics & Gynecology | Admitting: Obstetrics & Gynecology

## 2013-08-28 ENCOUNTER — Telehealth: Payer: Self-pay | Admitting: Family Medicine

## 2013-08-28 ENCOUNTER — Encounter (HOSPITAL_COMMUNITY): Payer: Self-pay | Admitting: *Deleted

## 2013-08-28 DIAGNOSIS — O9989 Other specified diseases and conditions complicating pregnancy, childbirth and the puerperium: Principal | ICD-10-CM

## 2013-08-28 DIAGNOSIS — O99891 Other specified diseases and conditions complicating pregnancy: Secondary | ICD-10-CM | POA: Insufficient documentation

## 2013-08-28 DIAGNOSIS — M79609 Pain in unspecified limb: Secondary | ICD-10-CM | POA: Insufficient documentation

## 2013-08-28 DIAGNOSIS — R109 Unspecified abdominal pain: Secondary | ICD-10-CM | POA: Insufficient documentation

## 2013-08-28 LAB — URINALYSIS, ROUTINE W REFLEX MICROSCOPIC
Bilirubin Urine: NEGATIVE
GLUCOSE, UA: NEGATIVE mg/dL
HGB URINE DIPSTICK: NEGATIVE
KETONES UR: NEGATIVE mg/dL
Leukocytes, UA: NEGATIVE
Nitrite: NEGATIVE
PROTEIN: NEGATIVE mg/dL
Specific Gravity, Urine: 1.01 (ref 1.005–1.030)
Urobilinogen, UA: 0.2 mg/dL (ref 0.0–1.0)
pH: 6.5 (ref 5.0–8.0)

## 2013-08-28 NOTE — Telephone Encounter (Signed)
I agree with [facility] sure that she is not having a blood clot in her leg. I will forward this to Dr. Ermalinda MemosBradshaw who [facility].

## 2013-08-28 NOTE — Telephone Encounter (Signed)
Spoke with patient and she is complaining of thigh pain, which is progressively getting worse to where she feels like she cannot move.  She has no known injury.  Pt feels she cant wait until tomorrow to be seen.  I [facility] patient [facility] can not [facility] her leg.  Pt verbalized understanding.  Kashish Yglesias, Darlyne RussianKristen L, CMA

## 2013-08-28 NOTE — MAU Note (Signed)
PT SAYS SHE HAS PAIN  IN LEFT INNER THIGH- STARTED   1 WEEK AGO -  BUT BECAME WORSE SAT AM.   NO MED.   SAT AM SHE WAS  IN KITCHEN - COOKING- THEN WALKED TO  BEDROOM-  .  CALLED DR TODAY-  GETS [facility] AT [facility]-   NURSE TOLD HER TO COME HERE.    ON Sunday AM - PAIN LESS AFTER MASSAGE  FROM HUSBAND .  THEN THIS AM - BAD AGAIN.

## 2013-08-28 NOTE — Telephone Encounter (Signed)
She has a pain in left leg and it is so bad that she cannot move Please advise

## 2013-08-28 NOTE — MAU Note (Signed)
Patient state she has been having pain in the upper inner thigh on the left leg for several days but getting worse. Lower abdominal pain "round ligament pain" started today. Denies bleeding and has a light vaginal discharge. Reports feeling fetal movement.

## 2013-08-30 ENCOUNTER — Ambulatory Visit (INDEPENDENT_AMBULATORY_CARE_PROVIDER_SITE_OTHER): Payer: No Typology Code available for payment source | Admitting: Family Medicine

## 2013-08-30 ENCOUNTER — Encounter: Payer: Self-pay | Admitting: Family Medicine

## 2013-08-30 ENCOUNTER — Ambulatory Visit (HOSPITAL_COMMUNITY)
Admission: RE | Admit: 2013-08-30 | Discharge: 2013-08-30 | Disposition: A | Discharge status: Home / Self Care | Payer: No Typology Code available for payment source | Source: Ambulatory Visit | Attending: Family Medicine | Admitting: Family Medicine

## 2013-08-30 VITALS — BP 105/69 | HR 90 | Temp 98.4°F | Wt 145.0 lb

## 2013-08-30 DIAGNOSIS — M79605 Pain in left leg: Secondary | ICD-10-CM

## 2013-08-30 DIAGNOSIS — M79609 Pain in unspecified limb: Secondary | ICD-10-CM | POA: Insufficient documentation

## 2013-08-30 NOTE — Progress Notes (Signed)
   Subjective:    Patient ID: Jacqueline Bond, female    DOB: April 18, 1981, 33 y.o.   MRN: [id number]  HPI  33 year old G2P0010 @ 22w2w by early u/s who presents for evaluation of leg and abdominal pain. She notes that the pain started on the left inguinal area and posterior thigh and radiated down towards her knee. She is also concerned that her "round ligament pain" is worsening with movements. She has to move slowly and lie in the left lateral decubitus position to get comfortable. She denies any recent trauma or heavy. No vaginal bleeding and baby moving regularly. No pains that feel like contractions.   Review of Systems Pos: scant white vag discharge Denies chest pain, shortness of breath, calf swelling    Objective:   Physical Exam BP 105/69  Pulse 90  Temp(Src) 98.4 F (36.9 C) (Oral)  Wt 145 lb (65.772 kg)  LMP 03/27/2013  Gen: well appearing, pleasant, CV: RRR, on murmurs Pulm: CTA-B Abd: gravid, palpable fundus 1 cm superior to umbilicus, fundal height 22 cm LE: marked tenderness of posterior left thigh without palpable cord or unilateral swelling; no tenderness of swelling of left calf or popliteal fossa   Fetal Doppler: 140 bpm      Assessment & Plan:  Abdominal  Pain - Consistent with normal pain in a pregnant woman; counseled Jacqueline Bond to anticipate this throughout pregnancy and to seek medical evaluation if associated with contraction, severe pelvic pain, vaginal bleeding or leakage of fluid  LE Pain - Likely MSK pain given appearance and history; Jacqueline very anxious and has seems to manifest this through physical symptoms; most important LE dopplers ruled out DVT so Jacqueline Bond counseled on conservative treatment with heat, aspercreme and tylenol; told to avoid NSAIDS; encouraged yoga and exercise

## 2013-08-30 NOTE — Progress Notes (Signed)
*  Preliminary Results* Left lower extremity venous duplex completed. Left lower extremity is negative for deep vein thrombosis. There is no evidence of left Baker's cyst.  08/30/2013 4:42 PM  Gertie FeyMichelle Kala Ambriz, RVT, RDCS, RDMS

## 2013-08-30 NOTE — Patient Instructions (Signed)
Ms. Jacqueline Bond,   We will get the results of the ultrasound of the leg to make sure there is no blood clot. Otherwise, please use tylenol, heating pads, and IcyHot cream for pain relief. Follow with Dr. Ermalinda MemosBradshaw for you regular OB visit.   Sincerely,   Dr. Clinton SawyerWilliamson

## 2013-09-05 ENCOUNTER — Encounter (INDEPENDENT_AMBULATORY_CARE_PROVIDER_SITE_OTHER): Payer: No Typology Code available for payment source | Admitting: Family Medicine

## 2013-09-05 ENCOUNTER — Ambulatory Visit (INDEPENDENT_AMBULATORY_CARE_PROVIDER_SITE_OTHER): Payer: No Typology Code available for payment source | Admitting: Family Medicine

## 2013-09-05 VITALS — BP 103/68 | Wt 148.9 lb

## 2013-09-05 DIAGNOSIS — Z3490 Encounter for supervision of normal pregnancy, unspecified, unspecified trimester: Secondary | ICD-10-CM

## 2013-09-05 DIAGNOSIS — Z23 Encounter for immunization: Secondary | ICD-10-CM

## 2013-09-05 DIAGNOSIS — Z3480 Encounter for supervision of other normal pregnancy, unspecified trimester: Secondary | ICD-10-CM

## 2013-09-05 NOTE — Progress Notes (Signed)
Jacqueline Bond is a 33 y.o. G2P0010 at 5586w1d for routine follow up.  She reports round ligament pain, feeling better after last week. .  See flow sheet for details.  A/P: Pregnancy at 6886w1d.  Doing well.   Pregnancy issues include mild anxiety but doing well with re-assurance Anatomy scan reviewed, repeat now for limited views, female Preterm labor precautions reviewed. Follow up 4 weeks.

## 2013-09-05 NOTE — Patient Instructions (Signed)
Great to see you !  Second Trimester of Pregnancy The second trimester is from week 13 through week 28, months 4 through 6. The second trimester is often a time when you feel your best. Your body has also adjusted to being pregnant, and you begin to feel better physically. Usually, morning sickness has lessened or quit completely, you may have more energy, and you may have an increase in appetite. The second trimester is also a time when the fetus is growing rapidly. At the end of the sixth month, the fetus is about 9 inches long and weighs about 1 pounds. You will likely begin to feel the baby move (quickening) between 18 and 20 weeks of the pregnancy. BODY CHANGES Your body goes through many changes during pregnancy. The changes vary from woman to woman.   Your weight will continue to increase. You will notice your lower abdomen bulging out.  You may begin to get stretch marks on your hips, abdomen, and breasts.  You may develop headaches that can be relieved by medicines approved by your caregiver.  You may urinate more often because the fetus is pressing on your bladder.  You may develop or continue to have heartburn as a result of your pregnancy.  You may develop constipation because certain hormones are causing the muscles that push waste through your intestines to slow down.  You may develop hemorrhoids or swollen, bulging veins (varicose veins).  You may have back pain because of the weight gain and pregnancy hormones relaxing your joints between the bones in your pelvis and as a result of a shift in weight and the muscles that support your balance.  Your breasts will continue to grow and be tender.  Your gums may bleed and may be sensitive to brushing and flossing.  Dark spots or blotches (chloasma, mask of pregnancy) may develop on your face. This will likely fade after the baby is born.  A dark line from your belly button to the pubic area (linea nigra) may appear. This will  likely fade after the baby is born. WHAT TO EXPECT AT YOUR PRENATAL VISITS During a routine prenatal visit:  You will be weighed to make sure you and the fetus are growing normally.  Your blood pressure will be taken.  Your abdomen will be measured to track your baby's growth.  The fetal heartbeat will be listened to.  Any test results from the previous visit will be discussed. Your caregiver may ask you:  How you are feeling.  If you are feeling the baby move.  If you have had any abnormal symptoms, such as leaking fluid, bleeding, severe headaches, or abdominal cramping.  If you have any questions. Other tests that may be performed during your second trimester include:  Blood tests that check for:  Low iron levels (anemia).  Gestational diabetes (between 24 and 28 weeks).  Rh antibodies.  Urine tests to check for infections, diabetes, or protein in the urine.  An ultrasound to confirm the proper growth and development of the baby.  An amniocentesis to check for possible genetic problems.  Fetal screens for spina bifida and Down syndrome. HOME CARE INSTRUCTIONS   Avoid all smoking, herbs, alcohol, and unprescribed drugs. These chemicals affect the formation and growth of the baby.  Follow your caregiver's instructions regarding medicine use. There are medicines that are either safe or unsafe to take during pregnancy.  Exercise only as directed by your caregiver. Experiencing uterine cramps is a good sign to stop  exercising.  Continue to eat regular, healthy meals.  Wear a good support bra for breast tenderness.  Do not use hot tubs, steam rooms, or saunas.  Wear your seat belt at all times when driving.  Avoid raw meat, uncooked cheese, cat litter boxes, and soil used by cats. These carry germs that can cause birth defects in the baby.  Take your prenatal vitamins.  Try taking a stool softener (if your caregiver approves) if you develop constipation. Eat  more high-fiber foods, such as fresh vegetables or fruit and whole grains. Drink plenty of fluids to keep your urine clear or pale yellow.  Take warm sitz baths to soothe any pain or discomfort caused by hemorrhoids. Use hemorrhoid cream if your caregiver approves.  If you develop varicose veins, wear support hose. Elevate your feet for 15 minutes, 3 4 times a day. Limit salt in your diet.  Avoid heavy lifting, wear low heel shoes, and practice good posture.  Rest with your legs elevated if you have leg cramps or low back pain.  Visit your dentist if you have not gone yet during your pregnancy. Use a soft toothbrush to brush your teeth and be gentle when you floss.  A sexual relationship may be continued unless your caregiver directs you otherwise.  Continue to go to all your prenatal visits as directed by your caregiver. SEEK MEDICAL CARE IF:   You have dizziness.  You have mild pelvic cramps, pelvic pressure, or nagging pain in the abdominal area.  You have persistent nausea, vomiting, or diarrhea.  You have a bad smelling vaginal discharge.  You have pain with urination. SEEK IMMEDIATE MEDICAL CARE IF:   You have a fever.  You are leaking fluid from your vagina.  You have spotting or bleeding from your vagina.  You have severe abdominal cramping or pain.  You have rapid weight gain or loss.  You have shortness of breath with chest pain.  You notice sudden or extreme swelling of your face, hands, ankles, feet, or legs.  You have not felt your baby move in over an hour.  You have severe headaches that do not go away with medicine.  You have vision changes. Document Released: 06/09/2001 Document Revised: 02/15/2013 Document Reviewed: 08/16/2012 [facility] Patient Information 2014 [employer] [place].

## 2013-09-06 NOTE — Progress Notes (Signed)
This encounter was created in error - please disregard.

## 2013-09-13 ENCOUNTER — Ambulatory Visit (HOSPITAL_COMMUNITY): Payer: No Typology Code available for payment source

## 2013-09-18 ENCOUNTER — Ambulatory Visit (HOSPITAL_COMMUNITY)
Admission: RE | Admit: 2013-09-18 | Discharge: 2013-09-18 | Disposition: A | Discharge status: Home / Self Care | Payer: No Typology Code available for payment source | Source: Ambulatory Visit | Attending: Family Medicine | Admitting: Family Medicine

## 2013-09-18 DIAGNOSIS — Z3490 Encounter for supervision of normal pregnancy, unspecified, unspecified trimester: Secondary | ICD-10-CM

## 2013-09-18 DIAGNOSIS — Z3689 Encounter for other specified antenatal screening: Secondary | ICD-10-CM | POA: Insufficient documentation

## 2013-09-21 ENCOUNTER — Telehealth: Payer: Self-pay | Admitting: Family Medicine

## 2013-09-21 NOTE — Telephone Encounter (Signed)
Repeat US is normal but still with limited views of the spine. They want repeat in 6-8 weeks for more complete views. Will ask nursing to inform.   Murtis SinkSam Jaselle Pryer, MD [facility] Resident, PGY-2 09/21/2013, 6:28 PM

## 2013-09-22 NOTE — Telephone Encounter (Signed)
Pt notified.  Welford Christmas L, CMA  

## 2013-09-25 ENCOUNTER — Telehealth: Payer: Self-pay | Admitting: Family Medicine

## 2013-09-25 DIAGNOSIS — Z3490 Encounter for supervision of normal pregnancy, unspecified, unspecified trimester: Secondary | ICD-10-CM

## 2013-09-25 NOTE — Telephone Encounter (Signed)
Pt called and is aware that she needs to go back in 6-8 weeks for another US, but she wants to speak to [place]. Myriam Jacobsonjw

## 2013-09-25 NOTE — Telephone Encounter (Signed)
Pt called back.  Pt states she elected not to have Quad screen drawn at appropriate time during her pregnancy.  Pt is 26 wks and wants to know if she can have that test done now.  Precepted question with Dr. Mauricio PoBreen, and told patient that it is too late to be drawn.  Pt states she is concerned about spina bifida and wants to know if there are any other tests that can be done now to determine if fetus has spina bifida.  I told patient that ultrasound is the best determination at this time.  Pt has appt with Dr. Ermalinda MemosBradshaw 10/02/2013 and told patient it could be discussed further at that time.  Bayla Mcgovern, Darlyne RussianKristen L, CMA

## 2013-09-25 NOTE — Telephone Encounter (Signed)
Called patient again and she is extremely concerned and anxious as to why she has to go back for a third time for an U/S.  i told patient that they were unable to visualize the spine fully and wanted to repeat in 6-8 weeks.  Pt states she spoke to several of her friends and they have never had to have several U/S.  Due to patient being extremely concerned, I will forward to PCP and request MD call patient regarding ultrasound.  Demetress Tift, Darlyne RussianKristen L, CMA

## 2013-09-25 NOTE — Telephone Encounter (Signed)
Called patient due to her concerns about repeat US.   Discussed with her that fetal growth is good and all identifiable parts are WNL. i discussed that occasionally due to position or other technical difficulties views can be limited. I provided reassurerance and will follow up US as requested after the read.   Murtis SinkSam Wojciech Willetts, MD [facility] Resident, PGY-2 09/25/2013, 12:28 PM

## 2013-10-04 ENCOUNTER — Encounter: Payer: Self-pay | Admitting: Family Medicine

## 2013-10-04 ENCOUNTER — Ambulatory Visit (INDEPENDENT_AMBULATORY_CARE_PROVIDER_SITE_OTHER): Payer: No Typology Code available for payment source | Admitting: Family Medicine

## 2013-10-04 VITALS — BP 102/60 | Temp 98.1°F | Wt 150.0 lb

## 2013-10-04 DIAGNOSIS — F3289 Other specified depressive episodes: Secondary | ICD-10-CM

## 2013-10-04 DIAGNOSIS — O99810 Abnormal glucose complicating pregnancy: Secondary | ICD-10-CM

## 2013-10-04 DIAGNOSIS — Z3480 Encounter for supervision of other normal pregnancy, unspecified trimester: Secondary | ICD-10-CM

## 2013-10-04 DIAGNOSIS — O24419 Gestational diabetes mellitus in pregnancy, unspecified control: Secondary | ICD-10-CM

## 2013-10-04 DIAGNOSIS — Z3490 Encounter for supervision of normal pregnancy, unspecified, unspecified trimester: Secondary | ICD-10-CM

## 2013-10-04 DIAGNOSIS — F329 Major depressive disorder, single episode, unspecified: Secondary | ICD-10-CM

## 2013-10-04 DIAGNOSIS — F32A Depression, unspecified: Secondary | ICD-10-CM

## 2013-10-04 LAB — GLUCOSE, CAPILLARY: Glucose-Capillary: 222 mg/dL — ABNORMAL HIGH (ref 70–99)

## 2013-10-04 LAB — CBC WITH DIFFERENTIAL/PLATELET
Basophils Absolute: 0 10*3/uL (ref 0.0–0.1)
Basophils Relative: 0 % (ref 0–1)
Eosinophils Absolute: 0.1 10*3/uL (ref 0.0–0.7)
Eosinophils Relative: 1 % (ref 0–5)
HCT: 35.1 % — ABNORMAL LOW (ref 36.0–46.0)
HEMOGLOBIN: 11.9 g/dL — AB (ref 12.0–15.0)
LYMPHS ABS: 1.8 10*3/uL (ref 0.7–4.0)
Lymphocytes Relative: 15 % (ref 12–46)
MCH: 30.2 pg (ref 26.0–34.0)
MCHC: 33.9 g/dL (ref 30.0–36.0)
MCV: 89.1 fL (ref 78.0–100.0)
MONOS PCT: 4 % (ref 3–12)
Monocytes Absolute: 0.5 10*3/uL (ref 0.1–1.0)
NEUTROS PCT: 80 % — AB (ref 43–77)
Neutro Abs: 9.5 10*3/uL — ABNORMAL HIGH (ref 1.7–7.7)
Platelets: 234 10*3/uL (ref 150–400)
RBC: 3.94 MIL/uL (ref 3.87–5.11)
RDW: 14 % (ref 11.5–15.5)
WBC: 11.9 10*3/uL — ABNORMAL HIGH (ref 4.0–10.5)

## 2013-10-04 LAB — RPR

## 2013-10-04 NOTE — Progress Notes (Signed)
Jacqueline Bond is a 33 y.o. G2P0010 at 3934w2d for routine follow up.  She reports increased round ligament pain, Thick white DC per usual with a small amount of watery dc yesterday that has now resolved.   See flow sheet for details.  A/P: Pregnancy at 2734w2d.  Doing well.   Pregnancy issues include depression, anxious affect   Infant feeding choice breast  Contraception choice Not sure yet   Tdapwas not given today. 1 hour glucola, CBC, RPR, and HIV were done today.   Pregnancy medical home forms were done today and reviewed.   RH status was reviewed and pt does not need Rhogam.  Rhogam was not given today.   Childbirth and education classes were offered. Preterm labor precautions reviewed. Kick counts reviewed. PHQ-9 score 7, improved from 11 previously, no SI  1 hour Glucose 222 today, discussed new diagnosis of gestational diabetes, discussed diet changes, wrote referral for High risk clinic, nursing unable to schedule but will call and schedule for early next week.   Follow up 2 weeks.

## 2013-10-04 NOTE — Assessment & Plan Note (Signed)
PHQ-9 score 7, no SI

## 2013-10-04 NOTE — Patient Instructions (Addendum)
Great to see you !  Schedule an appt with OB Clinic in 2-3 weeks  Schedule an appt with me in 4-5 weeks.    Diet Recommendations for Diabetes   Starchy (carb) foods include: Bread, rice, pasta, potatoes, corn, crackers, bagels, muffins, all baked goods.   Protein foods include: Meat, fish, poultry, eggs, dairy foods, and beans such as pinto and kidney beans (beans also provide carbohydrate).   1. Eat at least 3 meals and 1-2 snacks per day. Never go more than 4-5 hours while awake without eating.  2. Limit starchy foods to TWO per meal and ONE per snack. ONE portion of a starchy  food is equal to the following:   - ONE slice of bread (or its equivalent, such as half of a hamburger bun).   - 1/2 cup of a "scoopable" starchy food such as potatoes or rice.   - 1 OUNCE (28 grams) of starchy snack foods such as crackers or pretzels (look on label).   - 15 grams of carbohydrate as shown on food label.  3. Both lunch and dinner should include a protein food, a carb food, and vegetables.   - Obtain twice as many veg's as protein or carbohydrate foods for both lunch and dinner.   - Try to keep frozen veg's on hand for a quick vegetable serving.     - Fresh or frozen veg's are best.  4. Breakfast should always include protein.      Third Trimester of Pregnancy The third trimester is from week 29 through week 42, months 7 through 9. The third trimester is a time when the fetus is growing rapidly. At the end of the ninth month, the fetus is about 20 inches in length and weighs 6 10 pounds.  BODY CHANGES Your body goes through many changes during pregnancy. The changes vary from woman to woman.   Your weight will continue to increase. You can expect to gain 25 35 pounds (11 16 kg) by the end of the pregnancy.  You may begin to get stretch marks on your hips, abdomen, and breasts.  You may urinate more often because the fetus is moving lower into your pelvis and pressing on your bladder.  You  may develop or continue to have heartburn as a result of your pregnancy.  You may develop constipation because certain hormones are causing the muscles that push waste through your intestines to slow down.  You may develop hemorrhoids or swollen, bulging veins (varicose veins).  You may have pelvic pain because of the weight gain and pregnancy hormones relaxing your joints between the bones in your pelvis. Back aches may result from over exertion of the muscles supporting your posture.  Your breasts will continue to grow and be tender. A yellow discharge may leak from your breasts called colostrum.  Your belly button may stick out.  You may feel short of breath because of your expanding uterus.  You may notice the fetus "dropping," or moving lower in your abdomen.  You may have a bloody mucus discharge. This usually occurs a few days to a week before labor begins.  Your cervix becomes thin and soft (effaced) near your due date. WHAT TO EXPECT AT YOUR PRENATAL EXAMS  You will have prenatal exams every 2 weeks until week 36. Then, you will have weekly prenatal exams. During a routine prenatal visit:  You will be weighed to make sure you and the fetus are growing normally.  Your blood pressure is  taken.  Your abdomen will be measured to track your baby's growth.  The fetal heartbeat will be listened to.  Any test results from the previous visit will be discussed.  You may have a cervical check near your due date to see if you have effaced. At around 36 weeks, your caregiver will check your cervix. At the same time, your caregiver will also perform a test on the secretions of the vaginal tissue. This test is to determine if a type of bacteria, Group B streptococcus, is present. Your caregiver will explain this further. Your caregiver may ask you:  What your birth plan is.  How you are feeling.  If you are feeling the baby move.  If you have had any abnormal symptoms, such as  leaking fluid, bleeding, severe headaches, or abdominal cramping.  If you have any questions. Other tests or screenings that may be performed during your third trimester include:  Blood tests that check for low iron levels (anemia).  Fetal testing to check the health, activity level, and growth of the fetus. Testing is done if you have certain medical conditions or if there are problems during the pregnancy. FALSE LABOR You may feel small, irregular contractions that eventually go away. These are called Braxton Hicks contractions, or false labor. Contractions may last for hours, days, or even weeks before true labor sets in. If contractions come at regular intervals, intensify, or become painful, it is best to be seen by your caregiver.  SIGNS OF LABOR   Menstrual-like cramps.  Contractions that are 5 minutes apart or less.  Contractions that start on the top of the uterus and spread down to the lower abdomen and back.  A sense of increased pelvic pressure or back pain.  A watery or bloody mucus discharge that comes from the vagina. If you have any of these signs before the 37th week of pregnancy, call your caregiver right away. You need to go to the hospital to get checked immediately. HOME CARE INSTRUCTIONS   Avoid all smoking, herbs, alcohol, and unprescribed drugs. These chemicals affect the formation and growth of the baby.  Follow your caregiver's instructions regarding medicine use. There are medicines that are either safe or unsafe to take during pregnancy.  Exercise only as directed by your caregiver. Experiencing uterine cramps is a good sign to stop exercising.  Continue to eat regular, healthy meals.  Wear a good support bra for breast tenderness.  Do not use hot tubs, steam rooms, or saunas.  Wear your seat belt at all times when driving.  Avoid raw meat, uncooked cheese, cat litter boxes, and soil used by cats. These carry germs that can cause birth defects in the  baby.  Take your prenatal vitamins.  Try taking a stool softener (if your caregiver approves) if you develop constipation. Eat more high-fiber foods, such as fresh vegetables or fruit and whole grains. Drink plenty of fluids to keep your urine clear or pale yellow.  Take warm sitz baths to soothe any pain or discomfort caused by hemorrhoids. Use hemorrhoid cream if your caregiver approves.  If you develop varicose veins, wear support hose. Elevate your feet for 15 minutes, 3 4 times a day. Limit salt in your diet.  Avoid heavy lifting, wear low heal shoes, and practice good posture.  Rest a lot with your legs elevated if you have leg cramps or low back pain.  Visit your dentist if you have not gone during your pregnancy. Use a soft toothbrush  to brush your teeth and be gentle when you floss.  A sexual relationship may be continued unless your caregiver directs you otherwise.  Do not travel far distances unless it is absolutely necessary and only with the approval of your caregiver.  Take prenatal classes to understand, practice, and ask questions about the labor and delivery.  Make a trial run to the hospital.  Pack your hospital bag.  Prepare the baby's nursery.  Continue to go to all your prenatal visits as directed by your caregiver. SEEK MEDICAL CARE IF:  You are unsure if you are in labor or if your water has broken.  You have dizziness.  You have mild pelvic cramps, pelvic pressure, or nagging pain in your abdominal area.  You have persistent nausea, vomiting, or diarrhea.  You have a bad smelling vaginal discharge.  You have pain with urination. SEEK IMMEDIATE MEDICAL CARE IF:   You have a fever.  You are leaking fluid from your vagina.  You have spotting or bleeding from your vagina.  You have severe abdominal cramping or pain.  You have rapid weight loss or gain.  You have shortness of breath with chest pain.  You notice sudden or extreme swelling of  your face, hands, ankles, feet, or legs.  You have not felt your baby move in over an hour.  You have severe headaches that do not go away with medicine.  You have vision changes. Document Released: 06/09/2001 Document Revised: 02/15/2013 Document Reviewed: 08/16/2012 [facility] Patient Information 2014 [place], [place].

## 2013-10-04 NOTE — Progress Notes (Signed)
1 Hr GTT was 222, 3 Hr not indicated as this patient is High Risk. Dr Ermalinda MemosBradshaw notified of results.Jacqueline Bond

## 2013-10-05 ENCOUNTER — Telehealth: Payer: Self-pay | Admitting: Family Medicine

## 2013-10-05 LAB — HIV ANTIBODY (ROUTINE TESTING W REFLEX): HIV 1&2 Ab, 4th Generation: NONREACTIVE

## 2013-10-05 NOTE — Telephone Encounter (Signed)
Alvino ChapelJo is taking care of this and will call pt. Thank you. Arlyss Repress.Kitti Mcclish

## 2013-10-05 NOTE — Telephone Encounter (Signed)
Pt saw Dr Ermalinda Memosbradshaw yesterday.  Pt says a nurse was to call her back today before 9. [facility] Please advise

## 2013-10-05 NOTE — Telephone Encounter (Signed)
[facility] @ [facility]00am. Patient informed  of appointment.  Directions and [facility]was also given to her.she voiced understanding.Virgel BouquetGiovanna S Vennela Jutte '

## 2013-10-09 ENCOUNTER — Encounter: Payer: Self-pay | Admitting: Family Medicine

## 2013-10-09 ENCOUNTER — Ambulatory Visit (INDEPENDENT_AMBULATORY_CARE_PROVIDER_SITE_OTHER): Payer: No Typology Code available for payment source | Admitting: Family Medicine

## 2013-10-09 ENCOUNTER — Encounter: Payer: No Typology Code available for payment source | Attending: Family Medicine | Admitting: *Deleted

## 2013-10-09 VITALS — BP 108/71 | Temp 97.9°F | Wt 152.9 lb

## 2013-10-09 DIAGNOSIS — F32A Depression, unspecified: Secondary | ICD-10-CM

## 2013-10-09 DIAGNOSIS — O99810 Abnormal glucose complicating pregnancy: Secondary | ICD-10-CM | POA: Insufficient documentation

## 2013-10-09 DIAGNOSIS — Z713 Dietary counseling and surveillance: Secondary | ICD-10-CM | POA: Insufficient documentation

## 2013-10-09 DIAGNOSIS — F329 Major depressive disorder, single episode, unspecified: Secondary | ICD-10-CM

## 2013-10-09 DIAGNOSIS — O24419 Gestational diabetes mellitus in pregnancy, unspecified control: Secondary | ICD-10-CM

## 2013-10-09 DIAGNOSIS — Z3480 Encounter for supervision of other normal pregnancy, unspecified trimester: Secondary | ICD-10-CM

## 2013-10-09 DIAGNOSIS — F3289 Other specified depressive episodes: Secondary | ICD-10-CM

## 2013-10-09 DIAGNOSIS — Z3490 Encounter for supervision of normal pregnancy, unspecified, unspecified trimester: Secondary | ICD-10-CM

## 2013-10-09 LAB — POCT URINALYSIS DIP (DEVICE)
BILIRUBIN URINE: NEGATIVE
GLUCOSE, UA: NEGATIVE mg/dL
Hgb urine dipstick: NEGATIVE
Ketones, ur: NEGATIVE mg/dL
Nitrite: NEGATIVE
Protein, ur: NEGATIVE mg/dL
Specific Gravity, Urine: 1.02 (ref 1.005–1.030)
UROBILINOGEN UA: 0.2 mg/dL (ref 0.0–1.0)
pH: 6.5 (ref 5.0–8.0)

## 2013-10-09 NOTE — Progress Notes (Signed)
DIABETES:  Patient was seen for Gestational Diabetes self-management . The following learning objectives were met by the patient :   States the definition of Gestational Diabetes  States when to check blood glucose levels  Demonstrates proper blood glucose monitoring techniques  States the effect of stress and exercise on blood glucose levels  Plan:  Consider  increasing your activity level by walking daily as tolerated Begin checking BG before breakfast and 2 hours after first bit of breakfast, lunch and dinner after  as directed by MD    Blood glucose monitor given: True Test Lot # [id number] Exp: 11/10/15  Patient instructed to monitor glucose levels: FBS: 60 - <90 2 hour: <120  Patient received the following handouts:  Nutrition Diabetes and Pregnancy  Patient will be seen for follow-up as needed.

## 2013-10-09 NOTE — Patient Instructions (Signed)
Third Trimester of Pregnancy  The third trimester is from week 29 through week 42, months 7 through 9. The third trimester is a time when the fetus is growing rapidly. At the end of the ninth month, the fetus is about 20 inches in length and weighs 6 10 pounds.   BODY CHANGES  Your body goes through many changes during pregnancy. The changes vary from woman to woman.    Your weight will continue to increase. You can expect to gain 25 35 pounds (11 16 kg) by the end of the pregnancy.   You may begin to get stretch marks on your hips, abdomen, and breasts.   You may urinate more often because the fetus is moving lower into your pelvis and pressing on your bladder.   You may develop or continue to have heartburn as a result of your pregnancy.   You may develop constipation because certain hormones are causing the muscles that push waste through your intestines to slow down.   You may develop hemorrhoids or swollen, bulging veins (varicose veins).   You may have pelvic pain because of the weight gain and pregnancy hormones relaxing your joints between the bones in your pelvis. Back aches may result from over exertion of the muscles supporting your posture.   Your breasts will continue to grow and be tender. A yellow discharge may leak from your breasts called colostrum.   Your belly button may stick out.   You may feel short of breath because of your expanding uterus.   You may notice the fetus "dropping," or moving lower in your abdomen.   You may have a bloody mucus discharge. This usually occurs a few days to a week before labor begins.   Your cervix becomes thin and soft (effaced) near your due date.  WHAT TO EXPECT AT YOUR PRENATAL EXAMS   You will have prenatal exams every 2 weeks until week 36. Then, you will have weekly prenatal exams. During a routine prenatal visit:   You will be weighed to make sure you and the fetus are growing normally.   Your blood pressure is taken.   Your abdomen will be  measured to track your baby's growth.   The fetal heartbeat will be listened to.   Any test results from the previous visit will be discussed.   You may have a cervical check near your due date to see if you have effaced.  At around 36 weeks, your caregiver will check your cervix. At the same time, your caregiver will also perform a test on the secretions of the vaginal tissue. This test is to determine if a type of bacteria, Group B streptococcus, is present. Your caregiver will explain this further.  Your caregiver may ask you:   What your birth plan is.   How you are feeling.   If you are feeling the baby move.   If you have had any abnormal symptoms, such as leaking fluid, bleeding, severe headaches, or abdominal cramping.   If you have any questions.  Other tests or screenings that may be performed during your third trimester include:   Blood tests that check for low iron levels (anemia).   Fetal testing to check the health, activity level, and growth of the fetus. Testing is done if you have certain medical conditions or if there are problems during the pregnancy.  FALSE LABOR  You may feel small, irregular contractions that eventually go away. These are called Braxton Hicks contractions, or   false labor. Contractions may last for hours, days, or even weeks before true labor sets in. If contractions come at regular intervals, intensify, or become painful, it is best to be seen by your caregiver.   SIGNS OF LABOR    Menstrual-like cramps.   Contractions that are 5 minutes apart or less.   Contractions that start on the top of the uterus and spread down to the lower abdomen and back.   A sense of increased pelvic pressure or back pain.   A watery or bloody mucus discharge that comes from the vagina.  If you have any of these signs before the 37th week of pregnancy, call your caregiver right away. You need to go to the hospital to get checked immediately.  HOME CARE INSTRUCTIONS    Avoid all  smoking, herbs, alcohol, and unprescribed drugs. These chemicals affect the formation and growth of the baby.   Follow your caregiver's instructions regarding medicine use. There are medicines that are either safe or unsafe to take during pregnancy.   Exercise only as directed by your caregiver. Experiencing uterine cramps is a good sign to stop exercising.   Continue to eat regular, healthy meals.   Wear a good support bra for breast tenderness.   Do not use hot tubs, steam rooms, or saunas.   Wear your seat belt at all times when driving.   Avoid raw meat, uncooked cheese, cat litter boxes, and soil used by cats. These carry germs that can cause birth defects in the baby.   Take your prenatal vitamins.   Try taking a stool softener (if your caregiver approves) if you develop constipation. Eat more high-fiber foods, such as fresh vegetables or fruit and whole grains. Drink plenty of fluids to keep your urine clear or pale yellow.   Take warm sitz baths to soothe any pain or discomfort caused by hemorrhoids. Use hemorrhoid cream if your caregiver approves.   If you develop varicose veins, wear support hose. Elevate your feet for 15 minutes, 3 4 times a day. Limit salt in your diet.   Avoid heavy lifting, wear low heal shoes, and practice good posture.   Rest a lot with your legs elevated if you have leg cramps or low back pain.   Visit your dentist if you have not gone during your pregnancy. Use a soft toothbrush to brush your teeth and be gentle when you floss.   A sexual relationship may be continued unless your caregiver directs you otherwise.   Do not travel far distances unless it is absolutely necessary and only with the approval of your caregiver.   Take prenatal classes to understand, practice, and ask questions about the labor and delivery.   Make a trial run to the hospital.   Pack your hospital bag.   Prepare the baby's nursery.   Continue to go to all your prenatal visits as directed  by your caregiver.  SEEK MEDICAL CARE IF:   You are unsure if you are in labor or if your water has broken.   You have dizziness.   You have mild pelvic cramps, pelvic pressure, or nagging pain in your abdominal area.   You have persistent nausea, vomiting, or diarrhea.   You have a bad smelling vaginal discharge.   You have pain with urination.  SEEK IMMEDIATE MEDICAL CARE IF:    You have a fever.   You are leaking fluid from your vagina.   You have spotting or bleeding from your vagina.     You have severe abdominal cramping or pain.   You have rapid weight loss or gain.   You have shortness of breath with chest pain.   You notice sudden or extreme swelling of your face, hands, ankles, feet, or legs.   You have not felt your baby move in over an hour.   You have severe headaches that do not go away with medicine.   You have vision changes.  Document Released: 06/09/2001 Document Revised: 02/15/2013 Document Reviewed: 08/16/2012  [employer] Patient Information 2014 [employer]

## 2013-10-09 NOTE — Progress Notes (Signed)
Nutrition note: 1st visit consult & GDM diet education Pt is a newly diagnosed GDM pt. Pt has gained 22.9# @ 28w, which is slightly > expected. Pt reports eating 3 meals & 2-3 snacks/d. Pt is taking a PNV. Pt reports no N/V but has some heartburn. Pt reports walking ~1hr/d. Pt received verbal & written education on GDM diet. Discussed tips to decrease heartburn. Discussed wt gain goals of 25-35# or 1#/wk. Pt agrees to follow GDM diet with 3 meals & 2-3 snacks/d with proper CHO/ protein combination.  Pt has WIC & plans to BF. F/u in 2-4 wks Blondell RevealLaura Richel Millspaugh, MS, RD, LDN, [facility]

## 2013-10-09 NOTE — Progress Notes (Signed)
P=[id number]  Initial prenatal visit at this clinic

## 2013-10-09 NOTE — Progress Notes (Signed)
S: 33 yo G2P0010 here for 28w visit [facility] - no ctx, lof, vb. +FM - had a 1 hour of 222 last week - has not been instructed on meters or strips yet.   - trying to follow a diet off the internet.   O: see flowsheet  A/P  - diabetes education and nutrition today - f/u in 1 week with glucose readings to see how sugars are doing and whether we need medication.   - US reviewed  - 28 week labs done last week and reviewed also.

## 2013-10-16 ENCOUNTER — Encounter: Payer: No Typology Code available for payment source | Admitting: Family Medicine

## 2013-10-16 ENCOUNTER — Ambulatory Visit (INDEPENDENT_AMBULATORY_CARE_PROVIDER_SITE_OTHER): Payer: No Typology Code available for payment source | Admitting: Obstetrics & Gynecology

## 2013-10-16 ENCOUNTER — Encounter: Payer: No Typology Code available for payment source | Admitting: *Deleted

## 2013-10-16 DIAGNOSIS — O24419 Gestational diabetes mellitus in pregnancy, unspecified control: Secondary | ICD-10-CM

## 2013-10-16 DIAGNOSIS — O99810 Abnormal glucose complicating pregnancy: Secondary | ICD-10-CM

## 2013-10-16 NOTE — Progress Notes (Signed)
DIABETES: Review glucose readings FBS 6:7 within range, 2hpp 13:20 within range. She can relate elevated readings to specific foods and amounts. Pt c/o upper respiratory congestion and inability to sleep. After consulting with Dr. Maggie Fontdum, suggested Zyrtec and saline nasal spray. I suggested she might want to consider vaporizer in the room in which she is sleeping. If Zyrtec is not effective she could try sudafed for just a few days recognizing that it may increase BP slightly. Present BP readings are WNL.

## 2013-10-23 ENCOUNTER — Ambulatory Visit (INDEPENDENT_AMBULATORY_CARE_PROVIDER_SITE_OTHER): Payer: No Typology Code available for payment source | Admitting: Obstetrics & Gynecology

## 2013-10-23 VITALS — BP 115/73 | HR 84 | Temp 98.0°F | Wt 150.8 lb

## 2013-10-23 DIAGNOSIS — O24419 Gestational diabetes mellitus in pregnancy, unspecified control: Secondary | ICD-10-CM

## 2013-10-23 DIAGNOSIS — O99810 Abnormal glucose complicating pregnancy: Secondary | ICD-10-CM

## 2013-10-23 NOTE — Progress Notes (Signed)
Patient reports some abdominal discomfort when baby moves; patient reports feeling like her blood pressure gets low sometimes, causing her to feel lightheaded- states it is not related to any activity in particular

## 2013-10-23 NOTE — Progress Notes (Signed)
A few postprandials in 130-140s, normal fastings. Emphasized diet adherence. Also reported that she gets multiple erroneous readings from meter, will have DM educator evaluate and possible recalibrate meter. Follow up scan to reevaluate spine ordered, not well seen on previous scans; will follow up results and manage accordingly. No other complaints or concerns.  Fetal movement and labor precautions reviewed.

## 2013-10-23 NOTE — Patient Instructions (Signed)
Return to clinic for any obstetric concerns or go to [facility] for evaluation  

## 2013-10-31 ENCOUNTER — Ambulatory Visit: Payer: No Typology Code available for payment source

## 2013-10-31 ENCOUNTER — Ambulatory Visit (HOSPITAL_COMMUNITY)
Admission: RE | Admit: 2013-10-31 | Discharge: 2013-10-31 | Disposition: A | Discharge status: Home / Self Care | Payer: No Typology Code available for payment source | Source: Ambulatory Visit | Attending: Obstetrics & Gynecology | Admitting: Obstetrics & Gynecology

## 2013-10-31 ENCOUNTER — Other Ambulatory Visit: Payer: Self-pay | Admitting: Obstetrics & Gynecology

## 2013-10-31 ENCOUNTER — Encounter (HOSPITAL_COMMUNITY): Payer: Self-pay

## 2013-10-31 VITALS — BP 116/68 | HR 95 | Wt 151.0 lb

## 2013-10-31 DIAGNOSIS — Z3689 Encounter for other specified antenatal screening: Secondary | ICD-10-CM | POA: Insufficient documentation

## 2013-10-31 DIAGNOSIS — O0990 Supervision of high risk pregnancy, unspecified, unspecified trimester: Secondary | ICD-10-CM

## 2013-10-31 DIAGNOSIS — R109 Unspecified abdominal pain: Secondary | ICD-10-CM | POA: Insufficient documentation

## 2013-10-31 DIAGNOSIS — O24419 Gestational diabetes mellitus in pregnancy, unspecified control: Secondary | ICD-10-CM

## 2013-10-31 DIAGNOSIS — O99810 Abnormal glucose complicating pregnancy: Secondary | ICD-10-CM | POA: Insufficient documentation

## 2013-10-31 NOTE — ED Notes (Signed)
Patient states she is having generalized abdominal pain that is intermittent in nature and feels "muscular".  She does have intermittent contractions, but this does not feel like contractions. The pain does keep her up at night.  She denies any bleeding or LOF and her abdomen is soft to palpation.

## 2013-11-01 ENCOUNTER — Encounter: Payer: Self-pay | Admitting: Obstetrics & Gynecology

## 2013-11-06 ENCOUNTER — Ambulatory Visit (INDEPENDENT_AMBULATORY_CARE_PROVIDER_SITE_OTHER): Payer: No Typology Code available for payment source | Admitting: Obstetrics & Gynecology

## 2013-11-06 ENCOUNTER — Ambulatory Visit (HOSPITAL_COMMUNITY): Payer: No Typology Code available for payment source

## 2013-11-06 VITALS — BP 101/69 | HR 81 | Temp 97.9°F | Wt 150.4 lb

## 2013-11-06 DIAGNOSIS — O24419 Gestational diabetes mellitus in pregnancy, unspecified control: Secondary | ICD-10-CM

## 2013-11-06 DIAGNOSIS — O99810 Abnormal glucose complicating pregnancy: Secondary | ICD-10-CM

## 2013-11-06 DIAGNOSIS — O0990 Supervision of high risk pregnancy, unspecified, unspecified trimester: Secondary | ICD-10-CM

## 2013-11-06 LAB — POCT URINALYSIS DIP (DEVICE)
Bilirubin Urine: NEGATIVE
Glucose, UA: NEGATIVE mg/dL
Hgb urine dipstick: NEGATIVE
KETONES UR: NEGATIVE mg/dL
NITRITE: NEGATIVE
PROTEIN: NEGATIVE mg/dL
Specific Gravity, Urine: 1.02 (ref 1.005–1.030)
Urobilinogen, UA: 0.2 mg/dL (ref 0.0–1.0)
pH: 6.5 (ref 5.0–8.0)

## 2013-11-06 MED ORDER — GLYBURIDE 1.25 MG PO TABS: 1.2500 mg | ORAL_TABLET | Freq: Two times a day (BID) | ORAL | Status: DC

## 2013-11-06 NOTE — Progress Notes (Signed)
On review of blood sugars, she has three values >100 for fastings, one abnormal breakfast value of 132, four abnormal lunch values from 131-144; and five abnormal dinner values from 136-152. Counseled about needing to start medications; need for 2x/week antenatal testing and delivery by 39 weeks discussed. Will start Glyburide 1.25 mg po bid, testing to start next week. No other complaints or concerns.  Fetal movement and labor precautions reviewed.

## 2013-11-06 NOTE — Progress Notes (Signed)
Patient reports pelvic pressure and round ligament pains as well as nonpainful contractions

## 2013-11-06 NOTE — Patient Instructions (Signed)
Return to clinic for any obstetric concerns or go to [facility] for evaluation  

## 2013-11-10 ENCOUNTER — Other Ambulatory Visit: Payer: Self-pay | Admitting: Obstetrics & Gynecology

## 2013-11-10 MED ORDER — ACCU-CHEK FASTCLIX LANCETS MISC: 1.0000 [IU] | Freq: Four times a day (QID) | Status: DC

## 2013-11-13 ENCOUNTER — Encounter: Payer: Self-pay | Admitting: Family Medicine

## 2013-11-13 ENCOUNTER — Ambulatory Visit (INDEPENDENT_AMBULATORY_CARE_PROVIDER_SITE_OTHER): Payer: No Typology Code available for payment source | Admitting: Family Medicine

## 2013-11-13 DIAGNOSIS — O24419 Gestational diabetes mellitus in pregnancy, unspecified control: Secondary | ICD-10-CM

## 2013-11-13 DIAGNOSIS — O99810 Abnormal glucose complicating pregnancy: Secondary | ICD-10-CM

## 2013-11-13 LAB — POCT URINALYSIS DIP (DEVICE)
Bilirubin Urine: NEGATIVE
GLUCOSE, UA: NEGATIVE mg/dL
Hgb urine dipstick: NEGATIVE
Ketones, ur: NEGATIVE mg/dL
NITRITE: NEGATIVE
Protein, ur: NEGATIVE mg/dL
SPECIFIC GRAVITY, URINE: 1.025 (ref 1.005–1.030)
UROBILINOGEN UA: 0.2 mg/dL (ref 0.0–1.0)
pH: 6.5 (ref 5.0–8.0)

## 2013-11-13 NOTE — Progress Notes (Signed)
NST reviewed and reactive. FBS 71-103 2 hour pp 97-149 Did not start meds until yesterday and forgot pm dose--will re-eval med change next week. Discussed irritability

## 2013-11-13 NOTE — Patient Instructions (Signed)
Gestational Diabetes Mellitus Gestational diabetes mellitus, often simply referred to as gestational diabetes, is a type of diabetes that some women develop during pregnancy. In gestational diabetes, the pancreas does not make enough insulin (a hormone), the cells are less responsive to the insulin that is made (insulin resistance), or both.Normally, insulin moves sugars from food into the tissue cells. The tissue cells use the sugars for energy. The lack of insulin or the lack of normal response to insulin causes excess sugars to build up in the blood instead of going into the tissue cells. As a result, high blood sugar (hyperglycemia) develops. The effect of high sugar (glucose) levels can cause many complications.  RISK FACTORS You have an increased chance of developing gestational diabetes if you have a family history of diabetes and also have one or more of the following risk factors:  A body mass index over 30 (obesity).  A previous pregnancy with gestational diabetes.  An older age at the time of pregnancy. If blood glucose levels are kept in the normal range during pregnancy, women can have a healthy pregnancy. If your blood glucose levels are not well controlled, there may be risks to you, your unborn baby (fetus), your labor and delivery, or your newborn baby.  SYMPTOMS  If symptoms are experienced, they are much like symptoms you would normally expect during pregnancy. The symptoms of gestational diabetes include:   Increased thirst (polydipsia).  Increased urination (polyuria).  Increased urination during the night (nocturia).  Weight loss. This weight loss may be rapid.  Frequent, recurring infections.  Tiredness (fatigue).  Weakness.  Vision changes, such as blurred vision.  Fruity smell to your breath.  Abdominal pain. DIAGNOSIS Diabetes is diagnosed when blood glucose levels are increased. Your blood glucose level may be checked by one or more of the following  blood tests:  A fasting blood glucose test. You will not be allowed to eat for at least 8 hours before a blood sample is taken.  A random blood glucose test. Your blood glucose is checked at any time of the day regardless of when you ate.  A hemoglobin A1c blood glucose test. A hemoglobin A1c test provides information about blood glucose control over the previous 3 months.  An oral glucose tolerance test (OGTT). Your blood glucose is measured after you have not eaten (fasted) for 1 3 hours and then after you drink a glucose-containing beverage. Since the hormones that cause insulin resistance are highest at about 24 28 weeks of a pregnancy, an OGTT is usually performed during that time. If you have risk factors for gestational diabetes, your caregiver may test you for gestational diabetes earlier than 24 weeks of pregnancy. TREATMENT   You will need to take diabetes medicine or insulin daily to keep blood glucose levels in the desired range.  You will need to match insulin dosing with exercise and healthy food choices. The treatment goal is to maintain the before meal (preprandial), bedtime, and overnight blood glucose level at 60 99 mg/dL during pregnancy. The treatment goal is to further maintain peak after meal blood sugar (postprandial glucose) level at 100 140 mg/dL. HOME CARE INSTRUCTIONS   Have your hemoglobin A1c level checked twice a year.  Perform daily blood glucose monitoring as directed by your caregiver. It is common to perform frequent blood glucose monitoring.  Monitor urine ketones when you are ill and as directed by your caregiver.  Take your diabetes medicine and insulin as directed by your caregiver to maintain   your blood glucose level in the desired range.  Never run out of diabetes medicine or insulin. It is needed every day.  Adjust insulin based on your intake of carbohydrates. Carbohydrates can raise blood glucose levels but need to be included in your diet.  Carbohydrates provide vitamins, minerals, and fiber which are an essential part of a healthy diet. Carbohydrates are found in fruits, vegetables, whole grains, dairy products, legumes, and foods containing added sugars.    Eat healthy foods. Alternate 3 meals with 3 snacks.  Maintain a healthy weight gain. The usual total expected weight gain varies according to your prepregnancy body mass index (BMI).  Carry a medical alert card or wear your medical alert jewelry.  Carry a 15 gram carbohydrate snack with you at all times to treat low blood glucose (hypoglycemia). Some examples of 15 gram carbohydrate snacks include:  Glucose tablets, 3 or 4   Glucose gel, 15 gram tube  Raisins, 2 tablespoons (24 g)  Jelly beans, 6  Animal crackers, 8  Fruit juice, regular soda, or low fat milk, 4 ounces (120 mL)  Gummy treats, 9    Recognize hypoglycemia. Hypoglycemia during pregnancy occurs with blood glucose levels of 60 mg/dL and below. The risk for hypoglycemia increases when fasting or skipping meals, during or after intense exercise, and during sleep. Hypoglycemia symptoms can include:  Tremors or shakes.  Decreased ability to concentrate.  Sweating.  Increased heart rate.  Headache.  Dry mouth.  Hunger.  Irritability.  Anxiety.  Restless sleep.  Altered speech or coordination.  Confusion.  Treat hypoglycemia promptly. If you are alert and able to safely swallow, follow the 15:15 rule:  Take 15 20 grams of rapid-acting glucose or carbohydrate. Rapid-acting options include glucose gel, glucose tablets, or 4 ounces (120 mL) of fruit juice, regular soda, or low fat milk.  Check your blood glucose level 15 minutes after taking the glucose.   Take 15 20 grams more of glucose if the repeat blood glucose level is still 70 mg/dL or below.  Eat a meal or snack within 1 hour once blood glucose levels return to normal.  Be alert to polyuria and polydipsia which are early  signs of hyperglycemia. An early awareness of hyperglycemia allows for prompt treatment. Treat hyperglycemia as directed by your caregiver.  Engage in at least 30 minutes of physical activity a day or as directed by your caregiver. Ten minutes of physical activity timed 30 minutes after each meal is encouraged to control postprandial blood glucose levels.  Adjust your insulin dosing and food intake as needed if you start a new exercise or sport.  Follow your sick day plan at any time you are unable to eat or drink as usual.  Avoid tobacco and alcohol use.  Follow up with your caregiver regularly.  Follow the advice of your caregiver regarding your prenatal and post-delivery (postpartum) appointments, meal planning, exercise, medicines, vitamins, blood tests, other medical tests, and physical activities.  Perform daily skin and foot care. Examine your skin and feet daily for cuts, bruises, redness, nail problems, bleeding, blisters, or sores.  Brush your teeth and gums at least twice a day and floss at least once a day. Follow up with your dentist regularly.  Schedule an eye exam during the first trimester of your pregnancy or as directed by your caregiver.  Share your diabetes management plan with your workplace or school.  Stay up-to-date with immunizations.  Learn to manage stress.  Obtain ongoing diabetes education and   support as needed. SEEK MEDICAL CARE IF:   You are unable to eat food or drink fluids for more than 6 hours.  You have nausea and vomiting for more than 6 hours.  You have a blood glucose level of 200 mg/dL and you have ketones in your urine.  There is a change in mental status.  You develop vision problems.  You have a persistent headache.  You have upper abdominal pain or discomfort.  You develop an additional serious illness.  You have diarrhea for more than 6 hours.  You have been sick or have had a fever for a couple of days and are not getting  better. SEEK IMMEDIATE MEDICAL CARE IF:   You have difficulty breathing.  You no longer feel the baby moving.  You are bleeding or have discharge from your vagina.  You start having premature contractions or labor. MAKE SURE YOU:  Understand these instructions.  Will watch your condition.  Will get help right away if you are not doing well or get worse. Document Released: 09/21/2000 Document Revised: 10/10/2012 Document Reviewed: 01/12/2012 [employer] Patient Information 2014 [employer].  Breastfeeding Deciding to breastfeed is one of the best choices you can make for you and your baby. A change in hormones during pregnancy causes your breast tissue to grow and increases the number and size of your milk ducts. These hormones also allow proteins, sugars, and fats from your blood supply to make breast milk in your milk-producing glands. Hormones prevent breast milk from being released before your baby is born as well as prompt milk flow after birth. Once breastfeeding has begun, thoughts of your baby, as well as his or her sucking or crying, can stimulate the release of milk from your milk-producing glands.  BENEFITS OF BREASTFEEDING For Your Baby  Your first milk (colostrum) helps your baby's digestive system function better.   There are antibodies in your milk that help your baby fight off infections.   Your baby has a lower incidence of asthma, allergies, and sudden infant death syndrome.   The nutrients in breast milk are better for your baby than infant formulas and are designed uniquely for your baby's needs.   Breast milk improves your baby's brain development.   Your baby is less likely to develop other conditions, such as childhood obesity, asthma, or type 2 diabetes mellitus.  For You   Breastfeeding helps to create a very special bond between you and your baby.   Breastfeeding is convenient. Breast milk is always available at the correct temperature and  costs nothing.   Breastfeeding helps to burn calories and helps you lose the weight gained during pregnancy.   Breastfeeding makes your uterus contract to its prepregnancy size faster and slows bleeding (lochia) after you give birth.   Breastfeeding helps to lower your risk of developing type 2 diabetes mellitus, osteoporosis, and breast or ovarian cancer later in life. SIGNS THAT YOUR BABY IS HUNGRY Early Signs of Hunger  Increased alertness or activity.  Stretching.  Movement of the head from side to side.  Movement of the head and opening of the mouth when the corner of the mouth or cheek is stroked (rooting).  Increased sucking sounds, smacking lips, cooing, sighing, or squeaking.  Hand-to-mouth movements.  Increased sucking of fingers or hands. Late Signs of Hunger  Fussing.  Intermittent crying. Extreme Signs of Hunger Signs of extreme hunger will require calming and consoling before your baby will be able to breastfeed successfully. Do not   wait for the following signs of extreme hunger to occur before you initiate breastfeeding:   Restlessness.  A loud, strong cry.   Screaming. BREASTFEEDING BASICS Breastfeeding Initiation  Find a comfortable place to sit or lie down, with your neck and back well supported.  Place a pillow or rolled up blanket under your baby to bring him or her to the level of your breast (if you are seated). Nursing pillows are specially designed to help support your arms and your baby while you breastfeed.  Make sure that your baby's abdomen is facing your abdomen.   Gently massage your breast. With your fingertips, massage from your chest wall toward your nipple in a circular motion. This encourages milk flow. You may need to continue this action during the feeding if your milk flows slowly.  Support your breast with 4 fingers underneath and your thumb above your nipple. Make sure your fingers are well away from your nipple and your  baby's mouth.   Stroke your baby's lips gently with your finger or nipple.   When your baby's mouth is open wide enough, quickly bring your baby to your breast, placing your entire nipple and as much of the colored area around your nipple (areola) as possible into your baby's mouth.   More areola should be visible above your baby's upper lip than below the lower lip.   Your baby's tongue should be between his or her lower gum and your breast.   Ensure that your baby's mouth is correctly positioned around your nipple (latched). Your baby's lips should create a seal on your breast and be turned out (everted).  It is common for your baby to suck about 2 3 minutes in order to start the flow of breast milk. Latching Teaching your baby how to latch on to your breast properly is very important. An improper latch can cause nipple pain and decreased milk supply for you and poor weight gain in your baby. Also, if your baby is not latched onto your nipple properly, he or she may swallow some air during feeding. This can make your baby fussy. Burping your baby when you switch breasts during the feeding can help to get rid of the air. However, teaching your baby to latch on properly is still the best way to prevent fussiness from swallowing air while breastfeeding. Signs that your baby has successfully latched on to your nipple:    Silent tugging or silent sucking, without causing you pain.   Swallowing heard between every 3 4 sucks.    Muscle movement above and in front of his or her ears while sucking.  Signs that your baby has not successfully latched on to nipple:   Sucking sounds or smacking sounds from your baby while breastfeeding.  Nipple pain. If you think your baby has not latched on correctly, slip your finger into the corner of your baby's mouth to break the suction and place it between your baby's gums. Attempt breastfeeding initiation again. Signs of Successful  Breastfeeding Signs from your baby:   A gradual decrease in the number of sucks or complete cessation of sucking.   Falling asleep.   Relaxation of his or her body.   Retention of a small amount of milk in his or her mouth.   Letting go of your breast by himself or herself. Signs from you:  Breasts that have increased in firmness, weight, and size 1 3 hours after feeding.   Breasts that are softer immediately after breastfeeding.    Increased milk volume, as well as a change in milk consistency and color by the 5th day of breastfeeding.   Nipples that are not sore, cracked, or bleeding. Signs That Your Baby is Getting Enough Milk  Wetting at least 3 diapers in a 24-hour period. The urine should be clear and pale yellow by age 5 days.  At least 3 stools in a 24-hour period by age 5 days. The stool should be soft and yellow.  At least 3 stools in a 24-hour period by age 7 days. The stool should be seedy and yellow.  No loss of weight greater than 10% of birth weight during the first 3 days of age.  Average weight gain of 4 7 ounces (120 210 mL) per week after age 4 days.  Consistent daily weight gain by age 5 days, without weight loss after the age of 2 weeks. After a feeding, your baby may spit up a small amount. This is common. BREASTFEEDING FREQUENCY AND DURATION Frequent feeding will help you make more milk and can prevent sore nipples and breast engorgement. Breastfeed when you feel the need to reduce the fullness of your breasts or when your baby shows signs of hunger. This is called "breastfeeding on demand." Avoid introducing a pacifier to your baby while you are working to establish breastfeeding (the first 4 6 weeks after your baby is born). After this time you may choose to use a pacifier. Research has shown that pacifier use during the first year of a baby's life decreases the risk of sudden infant death syndrome (SIDS). Allow your baby to feed on each breast as  long as he or she wants. Breastfeed until your baby is finished feeding. When your baby unlatches or falls asleep while feeding from the first breast, offer the second breast. Because newborns are often sleepy in the first few weeks of life, you may need to awaken your baby to get him or her to feed. Breastfeeding times will vary from baby to baby. However, the following rules can serve as a guide to help you ensure that your baby is properly fed:  Newborns (babies 4 weeks of age or younger) may breastfeed every 1 3 hours.  Newborns should not go longer than 3 hours during the day or 5 hours during the night without breastfeeding.  You should breastfeed your baby a minimum of 8 times in a 24-hour period until you begin to introduce solid foods to your baby at around 6 months of age. BREAST MILK PUMPING Pumping and storing breast milk allows you to ensure that your baby is exclusively fed your breast milk, even at times when you are unable to breastfeed. This is especially important if you are going back to work while you are still breastfeeding or when you are not able to be present during feedings. Your lactation consultant can give you guidelines on how long it is safe to store breast milk.  A breast pump is a machine that allows you to pump milk from your breast into a sterile bottle. The pumped breast milk can then be stored in a refrigerator or freezer. Some breast pumps are operated by hand, while others use electricity. Ask your lactation consultant which type will work best for you. Breast pumps can be purchased, but some hospitals and breastfeeding support groups lease breast pumps on a monthly basis. A lactation consultant can teach you how to hand express breast milk, if you prefer not to use a pump.  CARING FOR   YOUR BREASTS WHILE YOU BREASTFEED Nipples can become dry, cracked, and sore while breastfeeding. The following recommendations can help keep your breasts moisturized and  healthy:  Avoid using soap on your nipples.   Wear a supportive bra. Although not required, special nursing bras and tank tops are designed to allow access to your breasts for breastfeeding without taking off your entire bra or top. Avoid wearing underwire style bras or extremely tight bras.  Air dry your nipples for 3 4minutes after each feeding.   Use only cotton bra pads to absorb leaked breast milk. Leaking of breast milk between feedings is normal.   Use lanolin on your nipples after breastfeeding. Lanolin helps to maintain your skin's normal moisture barrier. If you use pure lanolin you do not need to wash it off before feeding your baby again. Pure lanolin is not toxic to your baby. You may also hand express a few drops of breast milk and gently massage that milk into your nipples and allow the milk to air dry. In the first few weeks after giving birth, some women experience extremely full breasts (engorgement). Engorgement can make your breasts feel heavy, warm, and tender to the touch. Engorgement peaks within 3 5 days after you give birth. The following recommendations can help ease engorgement:  Completely empty your breasts while breastfeeding or pumping. You may want to start by applying warm, moist heat (in the shower or with warm water-soaked hand towels) just before feeding or pumping. This increases circulation and helps the milk flow. If your baby does not completely empty your breasts while breastfeeding, pump any extra milk after he or she is finished.  Wear a snug bra (nursing or regular) or tank top for 1 2 days to signal your body to slightly decrease milk production.  Apply ice packs to your breasts, unless this is too uncomfortable for you.  Make sure that your baby is latched on and positioned properly while breastfeeding. If engorgement persists after 48 hours of following these recommendations, contact your health care provider or a lactation consultant. OVERALL  HEALTH CARE RECOMMENDATIONS WHILE BREASTFEEDING  Eat healthy foods. Alternate between meals and snacks, eating 3 of each per day. Because what you eat affects your breast milk, some of the foods may make your baby more irritable than usual. Avoid eating these foods if you are sure that they are negatively affecting your baby.  Drink milk, fruit juice, and water to satisfy your thirst (about 10 glasses a day).   Rest often, relax, and continue to take your prenatal vitamins to prevent fatigue, stress, and anemia.  Continue breast self-awareness checks.  Avoid chewing and smoking tobacco.  Avoid alcohol and drug use. Some medicines that may be harmful to your baby can pass through breast milk. It is important to ask your health care provider before taking any medicine, including all over-the-counter and prescription medicine as well as vitamin and herbal supplements. It is possible to become pregnant while breastfeeding. If birth control is desired, ask your health care provider about options that will be safe for your baby. SEEK MEDICAL CARE IF:   You feel like you want to stop breastfeeding or have become frustrated with breastfeeding.  You have painful breasts or nipples.  Your nipples are cracked or bleeding.  Your breasts are red, tender, or warm.  You have a swollen area on either breast.  You have a fever or chills.  You have nausea or vomiting.  You have drainage other than breast   milk from your nipples.  Your breasts do not become full before feedings by the 5th day after you give birth.  You feel sad and depressed.  Your baby is too sleepy to eat well.  Your baby is having trouble sleeping.   Your baby is wetting less than 3 diapers in a 24-hour period.  Your baby has less than 3 stools in a 24-hour period.  Your baby's skin or the white part of his or her eyes becomes yellow.   Your baby is not gaining weight by 5 days of age. SEEK IMMEDIATE MEDICAL CARE  IF:   Your baby is overly tired (lethargic) and does not want to wake up and feed.  Your baby develops an unexplained fever. Document Released: 06/15/2005 Document Revised: 02/15/2013 Document Reviewed: 12/07/2012 [employer] Patient Information 2014 [employer].  

## 2013-11-16 ENCOUNTER — Ambulatory Visit (INDEPENDENT_AMBULATORY_CARE_PROVIDER_SITE_OTHER): Payer: No Typology Code available for payment source | Admitting: *Deleted

## 2013-11-16 VITALS — BP 106/66 | HR 92 | Wt 149.5 lb

## 2013-11-16 DIAGNOSIS — O24419 Gestational diabetes mellitus in pregnancy, unspecified control: Secondary | ICD-10-CM

## 2013-11-16 DIAGNOSIS — O99810 Abnormal glucose complicating pregnancy: Secondary | ICD-10-CM

## 2013-11-16 NOTE — Progress Notes (Addendum)
NST 11/16/13 reactive 

## 2013-11-21 ENCOUNTER — Ambulatory Visit (INDEPENDENT_AMBULATORY_CARE_PROVIDER_SITE_OTHER): Payer: No Typology Code available for payment source | Admitting: *Deleted

## 2013-11-21 VITALS — BP 113/65 | HR 92

## 2013-11-21 DIAGNOSIS — O24419 Gestational diabetes mellitus in pregnancy, unspecified control: Secondary | ICD-10-CM

## 2013-11-21 DIAGNOSIS — O99810 Abnormal glucose complicating pregnancy: Secondary | ICD-10-CM

## 2013-11-21 NOTE — Progress Notes (Signed)
Pt states she has difficulty remembering to take glyburide BID- usually remembers once daily and not always at the same time. [facility] log book reviewed- most wnl, about 5 PP readings elevated since last week- highest was 156. Pt advised that she needs the medication twice daily as prescribed. I suggested that she set the alarm on her phone as a reminder because she has the phone with her at all times. Will review values again at next visit on 5/28. Pt voiced understanding.

## 2013-11-22 NOTE — Progress Notes (Signed)
NST reactive on 11/21/13 

## 2013-11-23 ENCOUNTER — Ambulatory Visit (INDEPENDENT_AMBULATORY_CARE_PROVIDER_SITE_OTHER): Payer: No Typology Code available for payment source | Admitting: *Deleted

## 2013-11-23 DIAGNOSIS — O24419 Gestational diabetes mellitus in pregnancy, unspecified control: Secondary | ICD-10-CM

## 2013-11-23 DIAGNOSIS — O99810 Abnormal glucose complicating pregnancy: Secondary | ICD-10-CM

## 2013-11-23 LAB — US OB FOLLOW UP

## 2013-11-23 NOTE — Progress Notes (Signed)
Pt states she has made changes in her eating habits and has remembered to take her glyburide twice daily as discussed on 5/26.  Per her report, the 2hr PP sugars have not been above 122.  Pt advised to continue these changes.

## 2013-11-24 NOTE — Progress Notes (Signed)
NST reviewed and reactive.  Elzabeth Mcquerry L. Harraway-Smith, M.D., FACOG    

## 2013-11-27 ENCOUNTER — Ambulatory Visit (INDEPENDENT_AMBULATORY_CARE_PROVIDER_SITE_OTHER): Payer: No Typology Code available for payment source | Admitting: Family Medicine

## 2013-11-27 VITALS — BP 104/73 | HR 91 | Wt 151.3 lb

## 2013-11-27 DIAGNOSIS — O24419 Gestational diabetes mellitus in pregnancy, unspecified control: Secondary | ICD-10-CM

## 2013-11-27 DIAGNOSIS — O99810 Abnormal glucose complicating pregnancy: Secondary | ICD-10-CM

## 2013-11-27 LAB — POCT URINALYSIS DIP (DEVICE)
Bilirubin Urine: NEGATIVE
GLUCOSE, UA: NEGATIVE mg/dL
Hgb urine dipstick: NEGATIVE
Ketones, ur: NEGATIVE mg/dL
NITRITE: NEGATIVE
Protein, ur: NEGATIVE mg/dL
Specific Gravity, Urine: 1.015 (ref 1.005–1.030)
UROBILINOGEN UA: 0.2 mg/dL (ref 0.0–1.0)
pH: 5.5 (ref 5.0–8.0)

## 2013-11-27 NOTE — Patient Instructions (Signed)
Third Trimester of Pregnancy  The third trimester is from week 29 through week 42, months 7 through 9. The third trimester is a time when the fetus is growing rapidly. At the end of the ninth month, the fetus is about 20 inches in length and weighs 6 10 pounds.   BODY CHANGES  Your body goes through many changes during pregnancy. The changes vary from woman to woman.    Your weight will continue to increase. You can expect to gain 25 35 pounds (11 16 kg) by the end of the pregnancy.   You may begin to get stretch marks on your hips, abdomen, and breasts.   You may urinate more often because the fetus is moving lower into your pelvis and pressing on your bladder.   You may develop or continue to have heartburn as a result of your pregnancy.   You may develop constipation because certain hormones are causing the muscles that push waste through your intestines to slow down.   You may develop hemorrhoids or swollen, bulging veins (varicose veins).   You may have pelvic pain because of the weight gain and pregnancy hormones relaxing your joints between the bones in your pelvis. Back aches may result from over exertion of the muscles supporting your posture.   Your breasts will continue to grow and be tender. A yellow discharge may leak from your breasts called colostrum.   Your belly button may stick out.   You may feel short of breath because of your expanding uterus.   You may notice the fetus "dropping," or moving lower in your abdomen.   You may have a bloody mucus discharge. This usually occurs a few days to a week before labor begins.   Your cervix becomes thin and soft (effaced) near your due date.  WHAT TO EXPECT AT YOUR PRENATAL EXAMS   You will have prenatal exams every 2 weeks until week 36. Then, you will have weekly prenatal exams. During a routine prenatal visit:   You will be weighed to make sure you and the fetus are growing normally.   Your blood pressure is taken.   Your abdomen will be  measured to track your baby's growth.   The fetal heartbeat will be listened to.   Any test results from the previous visit will be discussed.   You may have a cervical check near your due date to see if you have effaced.  At around 36 weeks, your caregiver will check your cervix. At the same time, your caregiver will also perform a test on the secretions of the vaginal tissue. This test is to determine if a type of bacteria, Group B streptococcus, is present. Your caregiver will explain this further.  Your caregiver may ask you:   What your birth plan is.   How you are feeling.   If you are feeling the baby move.   If you have had any abnormal symptoms, such as leaking fluid, bleeding, severe headaches, or abdominal cramping.   If you have any questions.  Other tests or screenings that may be performed during your third trimester include:   Blood tests that check for low iron levels (anemia).   Fetal testing to check the health, activity level, and growth of the fetus. Testing is done if you have certain medical conditions or if there are problems during the pregnancy.  FALSE LABOR  You may feel small, irregular contractions that eventually go away. These are called Braxton Hicks contractions, or   false labor. Contractions may last for hours, days, or even weeks before true labor sets in. If contractions come at regular intervals, intensify, or become painful, it is best to be seen by your caregiver.   SIGNS OF LABOR    Menstrual-like cramps.   Contractions that are 5 minutes apart or less.   Contractions that start on the top of the uterus and spread down to the lower abdomen and back.   A sense of increased pelvic pressure or back pain.   A watery or bloody mucus discharge that comes from the vagina.  If you have any of these signs before the 37th week of pregnancy, call your caregiver right away. You need to go to the hospital to get checked immediately.  HOME CARE INSTRUCTIONS    Avoid all  smoking, herbs, alcohol, and unprescribed drugs. These chemicals affect the formation and growth of the baby.   Follow your caregiver's instructions regarding medicine use. There are medicines that are either safe or unsafe to take during pregnancy.   Exercise only as directed by your caregiver. Experiencing uterine cramps is a good sign to stop exercising.   Continue to eat regular, healthy meals.   Wear a good support bra for breast tenderness.   Do not use hot tubs, steam rooms, or saunas.   Wear your seat belt at all times when driving.   Avoid raw meat, uncooked cheese, cat litter boxes, and soil used by cats. These carry germs that can cause birth defects in the baby.   Take your prenatal vitamins.   Try taking a stool softener (if your caregiver approves) if you develop constipation. Eat more high-fiber foods, such as fresh vegetables or fruit and whole grains. Drink plenty of fluids to keep your urine clear or pale yellow.   Take warm sitz baths to soothe any pain or discomfort caused by hemorrhoids. Use hemorrhoid cream if your caregiver approves.   If you develop varicose veins, wear support hose. Elevate your feet for 15 minutes, 3 4 times a day. Limit salt in your diet.   Avoid heavy lifting, wear low heal shoes, and practice good posture.   Rest a lot with your legs elevated if you have leg cramps or low back pain.   Visit your dentist if you have not gone during your pregnancy. Use a soft toothbrush to brush your teeth and be gentle when you floss.   A sexual relationship may be continued unless your caregiver directs you otherwise.   Do not travel far distances unless it is absolutely necessary and only with the approval of your caregiver.   Take prenatal classes to understand, practice, and ask questions about the labor and delivery.   Make a trial run to the hospital.   Pack your hospital bag.   Prepare the baby's nursery.   Continue to go to all your prenatal visits as directed  by your caregiver.  SEEK MEDICAL CARE IF:   You are unsure if you are in labor or if your water has broken.   You have dizziness.   You have mild pelvic cramps, pelvic pressure, or nagging pain in your abdominal area.   You have persistent nausea, vomiting, or diarrhea.   You have a bad smelling vaginal discharge.   You have pain with urination.  SEEK IMMEDIATE MEDICAL CARE IF:    You have a fever.   You are leaking fluid from your vagina.   You have spotting or bleeding from your vagina.     You have severe abdominal cramping or pain.   You have rapid weight loss or gain.   You have shortness of breath with chest pain.   You notice sudden or extreme swelling of your face, hands, ankles, feet, or legs.   You have not felt your baby move in over an hour.   You have severe headaches that do not go away with medicine.   You have vision changes.  Document Released: 06/09/2001 Document Revised: 02/15/2013 Document Reviewed: 08/16/2012  [employer] Patient Information 2014 [employer]

## 2013-11-27 NOTE — Progress Notes (Signed)
Pt reports constant dull pain in front of abd since yesterday

## 2013-11-27 NOTE — Progress Notes (Signed)
S: 33yo [id number] @ [redacted]w[redacted]d here for robv - having a constant dull pain in the middle of her abdomen - even hurts to touch - not very painful but sensitive. Not itchy.  - no ctx, lof, vb. +FM  Forgets to take her glyburide at night (only taking it 4 times in the last 2 weeks) Fasting- 59-103 (all but the 103 were under 90) 2hr pp: <120 all breakfast and lunch.  Diner 111-164   O: see flowsheet  A/P - A2DM  not taking pm glyburide but all other sugars controlled. Advised to take from now on before dinner as then she can remember it with her meal. She states that won't be a problem with dinner.  - discussed will give her one more week but then if still out of control, will need to increase.  - cont twice weekly testing   PTL precautions discussed.

## 2013-11-28 ENCOUNTER — Ambulatory Visit (HOSPITAL_COMMUNITY): Payer: No Typology Code available for payment source

## 2013-11-30 ENCOUNTER — Ambulatory Visit (HOSPITAL_COMMUNITY)
Admission: RE | Admit: 2013-11-30 | Discharge: 2013-11-30 | Disposition: A | Discharge status: Home / Self Care | Payer: No Typology Code available for payment source | Source: Ambulatory Visit | Attending: Family Medicine | Admitting: Family Medicine

## 2013-11-30 ENCOUNTER — Ambulatory Visit (HOSPITAL_COMMUNITY)
Admission: RE | Admit: 2013-11-30 | Discharge: 2013-11-30 | Disposition: A | Discharge status: Home / Self Care | Payer: No Typology Code available for payment source | Source: Ambulatory Visit | Attending: Obstetrics & Gynecology | Admitting: Obstetrics & Gynecology

## 2013-11-30 ENCOUNTER — Encounter (HOSPITAL_COMMUNITY): Payer: Self-pay

## 2013-11-30 VITALS — BP 100/70 | HR 97 | Wt 153.0 lb

## 2013-11-30 DIAGNOSIS — O24419 Gestational diabetes mellitus in pregnancy, unspecified control: Secondary | ICD-10-CM

## 2013-11-30 DIAGNOSIS — O0990 Supervision of high risk pregnancy, unspecified, unspecified trimester: Secondary | ICD-10-CM

## 2013-11-30 DIAGNOSIS — Z3689 Encounter for other specified antenatal screening: Secondary | ICD-10-CM | POA: Insufficient documentation

## 2013-11-30 DIAGNOSIS — O99810 Abnormal glucose complicating pregnancy: Secondary | ICD-10-CM | POA: Insufficient documentation

## 2013-12-01 ENCOUNTER — Encounter: Payer: Self-pay | Admitting: Obstetrics & Gynecology

## 2013-12-01 DIAGNOSIS — O403XX0 Polyhydramnios, third trimester, not applicable or unspecified: Secondary | ICD-10-CM | POA: Insufficient documentation

## 2013-12-04 ENCOUNTER — Ambulatory Visit (INDEPENDENT_AMBULATORY_CARE_PROVIDER_SITE_OTHER): Payer: No Typology Code available for payment source | Admitting: Family Medicine

## 2013-12-04 ENCOUNTER — Encounter: Payer: Self-pay | Admitting: *Deleted

## 2013-12-04 ENCOUNTER — Encounter: Payer: Self-pay | Admitting: Family Medicine

## 2013-12-04 VITALS — BP 103/72 | HR 80 | Wt 150.9 lb

## 2013-12-04 DIAGNOSIS — O403XX0 Polyhydramnios, third trimester, not applicable or unspecified: Secondary | ICD-10-CM | POA: Insufficient documentation

## 2013-12-04 DIAGNOSIS — O24419 Gestational diabetes mellitus in pregnancy, unspecified control: Secondary | ICD-10-CM

## 2013-12-04 DIAGNOSIS — O99810 Abnormal glucose complicating pregnancy: Secondary | ICD-10-CM

## 2013-12-04 DIAGNOSIS — O409XX0 Polyhydramnios, unspecified trimester, not applicable or unspecified: Secondary | ICD-10-CM

## 2013-12-04 DIAGNOSIS — O0990 Supervision of high risk pregnancy, unspecified, unspecified trimester: Secondary | ICD-10-CM

## 2013-12-04 LAB — FETAL NONSTRESS TEST

## 2013-12-04 LAB — POCT URINALYSIS DIP (DEVICE)
BILIRUBIN URINE: NEGATIVE
Glucose, UA: NEGATIVE mg/dL
Hgb urine dipstick: NEGATIVE
Ketones, ur: NEGATIVE mg/dL
NITRITE: NEGATIVE
Protein, ur: 30 mg/dL — AB
Specific Gravity, Urine: >=1.03 (ref 1.005–1.030)
Urobilinogen, UA: 0.2 mg/dL (ref 0.0–1.0)
pH: 6 (ref 5.0–8.0)

## 2013-12-04 LAB — OB RESULTS CONSOLE GC/CHLAMYDIA
Chlamydia: NEGATIVE
GC PROBE AMP, GENITAL: NEGATIVE

## 2013-12-04 LAB — OB RESULTS CONSOLE GBS: GBS: NEGATIVE

## 2013-12-04 NOTE — Patient Instructions (Signed)
Gestational Diabetes Mellitus Gestational diabetes mellitus, often simply referred to as gestational diabetes, is a type of diabetes that some women develop during pregnancy. In gestational diabetes, the pancreas does not make enough insulin (a hormone), the cells are less responsive to the insulin that is made (insulin resistance), or both.Normally, insulin moves sugars from food into the tissue cells. The tissue cells use the sugars for energy. The lack of insulin or the lack of normal response to insulin causes excess sugars to build up in the blood instead of going into the tissue cells. As a result, high blood sugar (hyperglycemia) develops. The effect of high sugar (glucose) levels can cause many complications.  RISK FACTORS You have an increased chance of developing gestational diabetes if you have a family history of diabetes and also have one or more of the following risk factors:  A body mass index over 30 (obesity).  A previous pregnancy with gestational diabetes.  An older age at the time of pregnancy. If blood glucose levels are kept in the normal range during pregnancy, women can have a healthy pregnancy. If your blood glucose levels are not well controlled, there may be risks to you, your unborn baby (fetus), your labor and delivery, or your newborn baby.  SYMPTOMS  If symptoms are experienced, they are much like symptoms you would normally expect during pregnancy. The symptoms of gestational diabetes include:   Increased thirst (polydipsia).  Increased urination (polyuria).  Increased urination during the night (nocturia).  Weight loss. This weight loss may be rapid.  Frequent, recurring infections.  Tiredness (fatigue).  Weakness.  Vision changes, such as blurred vision.  Fruity smell to your breath.  Abdominal pain. DIAGNOSIS Diabetes is diagnosed when blood glucose levels are increased. Your blood glucose level may be checked by one or more of the following  blood tests:  A fasting blood glucose test. You will not be allowed to eat for at least 8 hours before a blood sample is taken.  A random blood glucose test. Your blood glucose is checked at any time of the day regardless of when you ate.  A hemoglobin A1c blood glucose test. A hemoglobin A1c test provides information about blood glucose control over the previous 3 months.  An oral glucose tolerance test (OGTT). Your blood glucose is measured after you have not eaten (fasted) for 1 3 hours and then after you drink a glucose-containing beverage. Since the hormones that cause insulin resistance are highest at about 24 28 weeks of a pregnancy, an OGTT is usually performed during that time. If you have risk factors for gestational diabetes, your caregiver may test you for gestational diabetes earlier than 24 weeks of pregnancy. TREATMENT   You will need to take diabetes medicine or insulin daily to keep blood glucose levels in the desired range.  You will need to match insulin dosing with exercise and healthy food choices. The treatment goal is to maintain the before meal (preprandial), bedtime, and overnight blood glucose level at 60 99 mg/dL during pregnancy. The treatment goal is to further maintain peak after meal blood sugar (postprandial glucose) level at 100 140 mg/dL. HOME CARE INSTRUCTIONS   Have your hemoglobin A1c level checked twice a year.  Perform daily blood glucose monitoring as directed by your caregiver. It is common to perform frequent blood glucose monitoring.  Monitor urine ketones when you are ill and as directed by your caregiver.  Take your diabetes medicine and insulin as directed by your caregiver to maintain   your blood glucose level in the desired range.  Never run out of diabetes medicine or insulin. It is needed every day.  Adjust insulin based on your intake of carbohydrates. Carbohydrates can raise blood glucose levels but need to be included in your diet.  Carbohydrates provide vitamins, minerals, and fiber which are an essential part of a healthy diet. Carbohydrates are found in fruits, vegetables, whole grains, dairy products, legumes, and foods containing added sugars.    Eat healthy foods. Alternate 3 meals with 3 snacks.  Maintain a healthy weight gain. The usual total expected weight gain varies according to your prepregnancy body mass index (BMI).  Carry a medical alert card or wear your medical alert jewelry.  Carry a 15 gram carbohydrate snack with you at all times to treat low blood glucose (hypoglycemia). Some examples of 15 gram carbohydrate snacks include:  Glucose tablets, 3 or 4   Glucose gel, 15 gram tube  Raisins, 2 tablespoons (24 g)  Jelly beans, 6  Animal crackers, 8  Fruit juice, regular soda, or low fat milk, 4 ounces (120 mL)  Gummy treats, 9    Recognize hypoglycemia. Hypoglycemia during pregnancy occurs with blood glucose levels of 60 mg/dL and below. The risk for hypoglycemia increases when fasting or skipping meals, during or after intense exercise, and during sleep. Hypoglycemia symptoms can include:  Tremors or shakes.  Decreased ability to concentrate.  Sweating.  Increased heart rate.  Headache.  Dry mouth.  Hunger.  Irritability.  Anxiety.  Restless sleep.  Altered speech or coordination.  Confusion.  Treat hypoglycemia promptly. If you are alert and able to safely swallow, follow the 15:15 rule:  Take 15 20 grams of rapid-acting glucose or carbohydrate. Rapid-acting options include glucose gel, glucose tablets, or 4 ounces (120 mL) of fruit juice, regular soda, or low fat milk.  Check your blood glucose level 15 minutes after taking the glucose.   Take 15 20 grams more of glucose if the repeat blood glucose level is still 70 mg/dL or below.  Eat a meal or snack within 1 hour once blood glucose levels return to normal.  Be alert to polyuria and polydipsia which are early  signs of hyperglycemia. An early awareness of hyperglycemia allows for prompt treatment. Treat hyperglycemia as directed by your caregiver.  Engage in at least 30 minutes of physical activity a day or as directed by your caregiver. Ten minutes of physical activity timed 30 minutes after each meal is encouraged to control postprandial blood glucose levels.  Adjust your insulin dosing and food intake as needed if you start a new exercise or sport.  Follow your sick day plan at any time you are unable to eat or drink as usual.  Avoid tobacco and alcohol use.  Follow up with your caregiver regularly.  Follow the advice of your caregiver regarding your prenatal and post-delivery (postpartum) appointments, meal planning, exercise, medicines, vitamins, blood tests, other medical tests, and physical activities.  Perform daily skin and foot care. Examine your skin and feet daily for cuts, bruises, redness, nail problems, bleeding, blisters, or sores.  Brush your teeth and gums at least twice a day and floss at least once a day. Follow up with your dentist regularly.  Schedule an eye exam during the first trimester of your pregnancy or as directed by your caregiver.  Share your diabetes management plan with your workplace or school.  Stay up-to-date with immunizations.  Learn to manage stress.  Obtain ongoing diabetes education and   support as needed. SEEK MEDICAL CARE IF:   You are unable to eat food or drink fluids for more than 6 hours.  You have nausea and vomiting for more than 6 hours.  You have a blood glucose level of 200 mg/dL and you have ketones in your urine.  There is a change in mental status.  You develop vision problems.  You have a persistent headache.  You have upper abdominal pain or discomfort.  You develop an additional serious illness.  You have diarrhea for more than 6 hours.  You have been sick or have had a fever for a couple of days and are not getting  better. SEEK IMMEDIATE MEDICAL CARE IF:   You have difficulty breathing.  You no longer feel the baby moving.  You are bleeding or have discharge from your vagina.  You start having premature contractions or labor. MAKE SURE YOU:  Understand these instructions.  Will watch your condition.  Will get help right away if you are not doing well or get worse. Document Released: 09/21/2000 Document Revised: 10/10/2012 Document Reviewed: 01/12/2012 [employer] Patient Information 2014 [employer]  Third Trimester of Pregnancy The third trimester is from week 29 through week 42, months 7 through 9. The third trimester is a time when the fetus is growing rapidly. At the end of the ninth month, the fetus is about 20 inches in length and weighs 6 10 pounds.  BODY CHANGES Your body goes through many changes during pregnancy. The changes vary from woman to woman.   Your weight will continue to increase. You can expect to gain 25 35 pounds (11 16 kg) by the end of the pregnancy.  You may begin to get stretch marks on your hips, abdomen, and breasts.  You may urinate more often because the fetus is moving lower into your pelvis and pressing on your bladder.  You may develop or continue to have heartburn as a result of your pregnancy.  You may develop constipation because certain hormones are causing the muscles that push waste through your intestines to slow down.  You may develop hemorrhoids or swollen, bulging veins (varicose veins).  You may have pelvic pain because of the weight gain and pregnancy hormones relaxing your joints between the bones in your pelvis. Back aches may result from over exertion of the muscles supporting your posture.  Your breasts will continue to grow and be tender. A yellow discharge may leak from your breasts called colostrum.  Your belly button may stick out.  You may feel short of breath because of your expanding uterus.  You may notice the fetus  "dropping," or moving lower in your abdomen.  You may have a bloody mucus discharge. This usually occurs a few days to a week before labor begins.  Your cervix becomes thin and soft (effaced) near your due date. WHAT TO EXPECT AT YOUR PRENATAL EXAMS  You will have prenatal exams every 2 weeks until week 36. Then, you will have weekly prenatal exams. During a routine prenatal visit:  You will be weighed to make sure you and the fetus are growing normally.  Your blood pressure is taken.  Your abdomen will be measured to track your baby's growth.  The fetal heartbeat will be listened to.  Any test results from the previous visit will be discussed.  You may have a cervical check near your due date to see if you have effaced. At around 36 weeks, your caregiver will check your cervix. At   the same time, your caregiver will also perform a test on the secretions of the vaginal tissue. This test is to determine if a type of bacteria, Group B streptococcus, is present. Your caregiver will explain this further. Your caregiver may ask you:  What your birth plan is.  How you are feeling.  If you are feeling the baby move.  If you have had any abnormal symptoms, such as leaking fluid, bleeding, severe headaches, or abdominal cramping.  If you have any questions. Other tests or screenings that may be performed during your third trimester include:  Blood tests that check for low iron levels (anemia).  Fetal testing to check the health, activity level, and growth of the fetus. Testing is done if you have certain medical conditions or if there are problems during the pregnancy. FALSE LABOR You may feel small, irregular contractions that eventually go away. These are called Braxton Hicks contractions, or false labor. Contractions may last for hours, days, or even weeks before true labor sets in. If contractions come at regular intervals, intensify, or become painful, it is best to be seen by your  caregiver.  SIGNS OF LABOR   Menstrual-like cramps.  Contractions that are 5 minutes apart or less.  Contractions that start on the top of the uterus and spread down to the lower abdomen and back.  A sense of increased pelvic pressure or back pain.  A watery or bloody mucus discharge that comes from the vagina. If you have any of these signs before the 37th week of pregnancy, call your caregiver right away. You need to go to the hospital to get checked immediately. HOME CARE INSTRUCTIONS   Avoid all smoking, herbs, alcohol, and unprescribed drugs. These chemicals affect the formation and growth of the baby.  Follow your caregiver's instructions regarding medicine use. There are medicines that are either safe or unsafe to take during pregnancy.  Exercise only as directed by your caregiver. Experiencing uterine cramps is a good sign to stop exercising.  Continue to eat regular, healthy meals.  Wear a good support bra for breast tenderness.  Do not use hot tubs, steam rooms, or saunas.  Wear your seat belt at all times when driving.  Avoid raw meat, uncooked cheese, cat litter boxes, and soil used by cats. These carry germs that can cause birth defects in the baby.  Take your prenatal vitamins.  Try taking a stool softener (if your caregiver approves) if you develop constipation. Eat more high-fiber foods, such as fresh vegetables or fruit and whole grains. Drink plenty of fluids to keep your urine clear or pale yellow.  Take warm sitz baths to soothe any pain or discomfort caused by hemorrhoids. Use hemorrhoid cream if your caregiver approves.  If you develop varicose veins, wear support hose. Elevate your feet for 15 minutes, 3 4 times a day. Limit salt in your diet.  Avoid heavy lifting, wear low heal shoes, and practice good posture.  Rest a lot with your legs elevated if you have leg cramps or low back pain.  Visit your dentist if you have not gone during your pregnancy.  Use a soft toothbrush to brush your teeth and be gentle when you floss.  A sexual relationship may be continued unless your caregiver directs you otherwise.  Do not travel far distances unless it is absolutely necessary and only with the approval of your caregiver.  Take prenatal classes to understand, practice, and ask questions about the labor and delivery.  Make   a trial run to the hospital.  Pack your hospital bag.  Prepare the baby's nursery.  Continue to go to all your prenatal visits as directed by your caregiver. SEEK MEDICAL CARE IF:  You are unsure if you are in labor or if your water has broken.  You have dizziness.  You have mild pelvic cramps, pelvic pressure, or nagging pain in your abdominal area.  You have persistent nausea, vomiting, or diarrhea.  You have a bad smelling vaginal discharge.  You have pain with urination. SEEK IMMEDIATE MEDICAL CARE IF:   You have a fever.  You are leaking fluid from your vagina.  You have spotting or bleeding from your vagina.  You have severe abdominal cramping or pain.  You have rapid weight loss or gain.  You have shortness of breath with chest pain.  You notice sudden or extreme swelling of your face, hands, ankles, feet, or legs.  You have not felt your baby move in over an hour.  You have severe headaches that do not go away with medicine.  You have vision changes. Document Released: 06/09/2001 Document Revised: 02/15/2013 Document Reviewed: 08/16/2012 [employer] Patient Information 2014 [employer].  Breastfeeding Deciding to breastfeed is one of the best choices you can make for you and your baby. A change in hormones during pregnancy causes your breast tissue to grow and increases the number and size of your milk ducts. These hormones also allow proteins, sugars, and fats from your blood supply to make breast milk in your milk-producing glands. Hormones prevent breast milk from being released before your  baby is born as well as prompt milk flow after birth. Once breastfeeding has begun, thoughts of your baby, as well as his or her sucking or crying, can stimulate the release of milk from your milk-producing glands.  BENEFITS OF BREASTFEEDING For Your Baby  Your first milk (colostrum) helps your baby's digestive system function better.   There are antibodies in your milk that help your baby fight off infections.   Your baby has a lower incidence of asthma, allergies, and sudden infant death syndrome.   The nutrients in breast milk are better for your baby than infant formulas and are designed uniquely for your baby's needs.   Breast milk improves your baby's brain development.   Your baby is less likely to develop other conditions, such as childhood obesity, asthma, or type 2 diabetes mellitus.  For You   Breastfeeding helps to create a very special bond between you and your baby.   Breastfeeding is convenient. Breast milk is always available at the correct temperature and costs nothing.   Breastfeeding helps to burn calories and helps you lose the weight gained during pregnancy.   Breastfeeding makes your uterus contract to its prepregnancy size faster and slows bleeding (lochia) after you give birth.   Breastfeeding helps to lower your risk of developing type 2 diabetes mellitus, osteoporosis, and breast or ovarian cancer later in life. SIGNS THAT YOUR BABY IS HUNGRY Early Signs of Hunger  Increased alertness or activity.  Stretching.  Movement of the head from side to side.  Movement of the head and opening of the mouth when the corner of the mouth or cheek is stroked (rooting).  Increased sucking sounds, smacking lips, cooing, sighing, or squeaking.  Hand-to-mouth movements.  Increased sucking of fingers or hands. Late Signs of Hunger  Fussing.  Intermittent crying. Extreme Signs of Hunger Signs of extreme hunger will require calming and consoling before  your   baby will be able to breastfeed successfully. Do not wait for the following signs of extreme hunger to occur before you initiate breastfeeding:   Restlessness.  A loud, strong cry.   Screaming. BREASTFEEDING BASICS Breastfeeding Initiation  Find a comfortable place to sit or lie down, with your neck and back well supported.  Place a pillow or rolled up blanket under your baby to bring him or her to the level of your breast (if you are seated). Nursing pillows are specially designed to help support your arms and your baby while you breastfeed.  Make sure that your baby's abdomen is facing your abdomen.   Gently massage your breast. With your fingertips, massage from your chest wall toward your nipple in a circular motion. This encourages milk flow. You may need to continue this action during the feeding if your milk flows slowly.  Support your breast with 4 fingers underneath and your thumb above your nipple. Make sure your fingers are well away from your nipple and your baby's mouth.   Stroke your baby's lips gently with your finger or nipple.   When your baby's mouth is open wide enough, quickly bring your baby to your breast, placing your entire nipple and as much of the colored area around your nipple (areola) as possible into your baby's mouth.   More areola should be visible above your baby's upper lip than below the lower lip.   Your baby's tongue should be between his or her lower gum and your breast.   Ensure that your baby's mouth is correctly positioned around your nipple (latched). Your baby's lips should create a seal on your breast and be turned out (everted).  It is common for your baby to suck about 2 3 minutes in order to start the flow of breast milk. Latching Teaching your baby how to latch on to your breast properly is very important. An improper latch can cause nipple pain and decreased milk supply for you and poor weight gain in your baby. Also, if  your baby is not latched onto your nipple properly, he or she may swallow some air during feeding. This can make your baby fussy. Burping your baby when you switch breasts during the feeding can help to get rid of the air. However, teaching your baby to latch on properly is still the best way to prevent fussiness from swallowing air while breastfeeding. Signs that your baby has successfully latched on to your nipple:    Silent tugging or silent sucking, without causing you pain.   Swallowing heard between every 3 4 sucks.    Muscle movement above and in front of his or her ears while sucking.  Signs that your baby has not successfully latched on to nipple:   Sucking sounds or smacking sounds from your baby while breastfeeding.  Nipple pain. If you think your baby has not latched on correctly, slip your finger into the corner of your baby's mouth to break the suction and place it between your baby's gums. Attempt breastfeeding initiation again. Signs of Successful Breastfeeding Signs from your baby:   A gradual decrease in the number of sucks or complete cessation of sucking.   Falling asleep.   Relaxation of his or her body.   Retention of a small amount of milk in his or her mouth.   Letting go of your breast by himself or herself. Signs from you:  Breasts that have increased in firmness, weight, and size 1 3 hours after feeding.     Breasts that are softer immediately after breastfeeding.  Increased milk volume, as well as a change in milk consistency and color by the 5th day of breastfeeding.   Nipples that are not sore, cracked, or bleeding. Signs That Your Baby is Getting Enough Milk  Wetting at least 3 diapers in a 24-hour period. The urine should be clear and pale yellow by age 5 days.  At least 3 stools in a 24-hour period by age 5 days. The stool should be soft and yellow.  At least 3 stools in a 24-hour period by age 7 days. The stool should be seedy and  yellow.  No loss of weight greater than 10% of birth weight during the first 3 days of age.  Average weight gain of 4 7 ounces (120 210 mL) per week after age 4 days.  Consistent daily weight gain by age 5 days, without weight loss after the age of 2 weeks. After a feeding, your baby may spit up a small amount. This is common. BREASTFEEDING FREQUENCY AND DURATION Frequent feeding will help you make more milk and can prevent sore nipples and breast engorgement. Breastfeed when you feel the need to reduce the fullness of your breasts or when your baby shows signs of hunger. This is called "breastfeeding on demand." Avoid introducing a pacifier to your baby while you are working to establish breastfeeding (the first 4 6 weeks after your baby is born). After this time you may choose to use a pacifier. Research has shown that pacifier use during the first year of a baby's life decreases the risk of sudden infant death syndrome (SIDS). Allow your baby to feed on each breast as long as he or she wants. Breastfeed until your baby is finished feeding. When your baby unlatches or falls asleep while feeding from the first breast, offer the second breast. Because newborns are often sleepy in the first few weeks of life, you may need to awaken your baby to get him or her to feed. Breastfeeding times will vary from baby to baby. However, the following rules can serve as a guide to help you ensure that your baby is properly fed:  Newborns (babies 4 weeks of age or younger) may breastfeed every 1 3 hours.  Newborns should not go longer than 3 hours during the day or 5 hours during the night without breastfeeding.  You should breastfeed your baby a minimum of 8 times in a 24-hour period until you begin to introduce solid foods to your baby at around 6 months of age. BREAST MILK PUMPING Pumping and storing breast milk allows you to ensure that your baby is exclusively fed your breast milk, even at times when you  are unable to breastfeed. This is especially important if you are going back to work while you are still breastfeeding or when you are not able to be present during feedings. Your lactation consultant can give you guidelines on how long it is safe to store breast milk.  A breast pump is a machine that allows you to pump milk from your breast into a sterile bottle. The pumped breast milk can then be stored in a refrigerator or freezer. Some breast pumps are operated by hand, while others use electricity. Ask your lactation consultant which type will work best for you. Breast pumps can be purchased, but some hospitals and breastfeeding support groups lease breast pumps on a monthly basis. A lactation consultant can teach you how to hand express breast milk, if you   prefer not to use a pump.  CARING FOR YOUR BREASTS WHILE YOU BREASTFEED Nipples can become dry, cracked, and sore while breastfeeding. The following recommendations can help keep your breasts moisturized and healthy:  Avoid using soap on your nipples.   Wear a supportive bra. Although not required, special nursing bras and tank tops are designed to allow access to your breasts for breastfeeding without taking off your entire bra or top. Avoid wearing underwire style bras or extremely tight bras.  Air dry your nipples for 3 4minutes after each feeding.   Use only cotton bra pads to absorb leaked breast milk. Leaking of breast milk between feedings is normal.   Use lanolin on your nipples after breastfeeding. Lanolin helps to maintain your skin's normal moisture barrier. If you use pure lanolin you do not need to wash it off before feeding your baby again. Pure lanolin is not toxic to your baby. You may also hand express a few drops of breast milk and gently massage that milk into your nipples and allow the milk to air dry. In the first few weeks after giving birth, some women experience extremely full breasts (engorgement). Engorgement can  make your breasts feel heavy, warm, and tender to the touch. Engorgement peaks within 3 5 days after you give birth. The following recommendations can help ease engorgement:  Completely empty your breasts while breastfeeding or pumping. You may want to start by applying warm, moist heat (in the shower or with warm water-soaked hand towels) just before feeding or pumping. This increases circulation and helps the milk flow. If your baby does not completely empty your breasts while breastfeeding, pump any extra milk after he or she is finished.  Wear a snug bra (nursing or regular) or tank top for 1 2 days to signal your body to slightly decrease milk production.  Apply ice packs to your breasts, unless this is too uncomfortable for you.  Make sure that your baby is latched on and positioned properly while breastfeeding. If engorgement persists after 48 hours of following these recommendations, contact your health care provider or a lactation consultant. OVERALL HEALTH CARE RECOMMENDATIONS WHILE BREASTFEEDING  Eat healthy foods. Alternate between meals and snacks, eating 3 of each per day. Because what you eat affects your breast milk, some of the foods may make your baby more irritable than usual. Avoid eating these foods if you are sure that they are negatively affecting your baby.  Drink milk, fruit juice, and water to satisfy your thirst (about 10 glasses a day).   Rest often, relax, and continue to take your prenatal vitamins to prevent fatigue, stress, and anemia.  Continue breast self-awareness checks.  Avoid chewing and smoking tobacco.  Avoid alcohol and drug use. Some medicines that may be harmful to your baby can pass through breast milk. It is important to ask your health care provider before taking any medicine, including all over-the-counter and prescription medicine as well as vitamin and herbal supplements. It is possible to become pregnant while breastfeeding. If birth control  is desired, ask your health care provider about options that will be safe for your baby. SEEK MEDICAL CARE IF:   You feel like you want to stop breastfeeding or have become frustrated with breastfeeding.  You have painful breasts or nipples.  Your nipples are cracked or bleeding.  Your breasts are red, tender, or warm.  You have a swollen area on either breast.  You have a fever or chills.  You have nausea   or vomiting.  You have drainage other than breast milk from your nipples.  Your breasts do not become full before feedings by the 5th day after you give birth.  You feel sad and depressed.  Your baby is too sleepy to eat well.  Your baby is having trouble sleeping.   Your baby is wetting less than 3 diapers in a 24-hour period.  Your baby has less than 3 stools in a 24-hour period.  Your baby's skin or the white part of his or her eyes becomes yellow.   Your baby is not gaining weight by 5 days of age. SEEK IMMEDIATE MEDICAL CARE IF:   Your baby is overly tired (lethargic) and does not want to wake up and feed.  Your baby develops an unexplained fever. Document Released: 06/15/2005 Document Revised: 02/15/2013 Document Reviewed: 12/07/2012 [employer] Patient Information 2014 [employer].  

## 2013-12-04 NOTE — Progress Notes (Signed)
NST reviewed and reactive. Needs culture-today FBS 53-78--Having some low BS at breakfast--add protein to dinner. 2 hour pp 78-143 (5 of 20 out of range) U/S showed 5 lb 15 oz (64%), appropriate growth but poly, vertex

## 2013-12-05 LAB — GC/CHLAMYDIA PROBE AMP
CT Probe RNA: NEGATIVE
GC Probe RNA: NEGATIVE

## 2013-12-06 ENCOUNTER — Encounter (HOSPITAL_COMMUNITY): Payer: Self-pay | Admitting: *Deleted

## 2013-12-06 ENCOUNTER — Inpatient Hospital Stay (HOSPITAL_COMMUNITY)
Admission: AD | Admit: 2013-12-06 | Discharge: 2013-12-06 | Disposition: A | Discharge status: Home / Self Care | Payer: No Typology Code available for payment source | Source: Ambulatory Visit | Attending: Obstetrics and Gynecology | Admitting: Obstetrics and Gynecology

## 2013-12-06 DIAGNOSIS — O368190 Decreased fetal movements, unspecified trimester, not applicable or unspecified: Secondary | ICD-10-CM | POA: Insufficient documentation

## 2013-12-06 DIAGNOSIS — O99810 Abnormal glucose complicating pregnancy: Secondary | ICD-10-CM | POA: Insufficient documentation

## 2013-12-06 LAB — CULTURE, BETA STREP (GROUP B ONLY)

## 2013-12-06 NOTE — MAU Note (Signed)
Pt is currently coming for biweekly NST's for gestational diabetes.  Last NST was Monday, pt reports her VE was closed at that appt.  Felt U/C's early today but now they have slowed down.  No vaginal bleeding, ROM, or abnormal itching or burning of discharge.  Pt reports decreased FM.  Pt has been feeling movement but a lot less than normal for her baby.  Now that she has been placed on EFM is feeling more movements.  Next NST is scheduled for tomorrow.

## 2013-12-07 ENCOUNTER — Ambulatory Visit (INDEPENDENT_AMBULATORY_CARE_PROVIDER_SITE_OTHER): Payer: No Typology Code available for payment source | Admitting: *Deleted

## 2013-12-07 ENCOUNTER — Encounter: Payer: Self-pay | Admitting: Family Medicine

## 2013-12-07 VITALS — BP 109/63 | HR 86

## 2013-12-07 DIAGNOSIS — O403XX0 Polyhydramnios, third trimester, not applicable or unspecified: Secondary | ICD-10-CM

## 2013-12-07 DIAGNOSIS — O24419 Gestational diabetes mellitus in pregnancy, unspecified control: Secondary | ICD-10-CM

## 2013-12-07 DIAGNOSIS — O99810 Abnormal glucose complicating pregnancy: Secondary | ICD-10-CM

## 2013-12-07 DIAGNOSIS — O409XX0 Polyhydramnios, unspecified trimester, not applicable or unspecified: Secondary | ICD-10-CM

## 2013-12-07 LAB — GLUCOSE, CAPILLARY: Glucose-Capillary: 78 mg/dL (ref 70–99)

## 2013-12-07 NOTE — Progress Notes (Addendum)
Pt had MAU visit yesterday for decreased FM- FHR was reactive. She states FM has improved.  Pt reports getting inconsistent readings from her glucometer. I checked CBG against our meter- pt's meter read 66 and hospital meter read 78 from same sample. I advised pt that the readings are within appropriate deviation and that all meters will not read exactly the same.  Pt felt reassured. Pt also reports having frequent elevated CBG readings after meals. We discussed her mealtimes and found that she was eating dinner too late in the evening and also was not always consistent with following the diet. She agreed to make changes in more strict adherence to her diet as well as adjust her eating schedule.

## 2013-12-07 NOTE — Progress Notes (Signed)
NST reactive on 12/07/13 

## 2013-12-11 ENCOUNTER — Ambulatory Visit (INDEPENDENT_AMBULATORY_CARE_PROVIDER_SITE_OTHER): Payer: No Typology Code available for payment source | Admitting: Obstetrics & Gynecology

## 2013-12-11 VITALS — BP 98/69 | HR 91 | Temp 97.8°F | Wt 152.2 lb

## 2013-12-11 DIAGNOSIS — O99810 Abnormal glucose complicating pregnancy: Secondary | ICD-10-CM

## 2013-12-11 DIAGNOSIS — O403XX0 Polyhydramnios, third trimester, not applicable or unspecified: Secondary | ICD-10-CM

## 2013-12-11 DIAGNOSIS — O409XX0 Polyhydramnios, unspecified trimester, not applicable or unspecified: Secondary | ICD-10-CM

## 2013-12-11 DIAGNOSIS — O24419 Gestational diabetes mellitus in pregnancy, unspecified control: Secondary | ICD-10-CM

## 2013-12-11 LAB — POCT URINALYSIS DIP (DEVICE)
Bilirubin Urine: NEGATIVE
GLUCOSE, UA: NEGATIVE mg/dL
HGB URINE DIPSTICK: NEGATIVE
KETONES UR: NEGATIVE mg/dL
Nitrite: NEGATIVE
PROTEIN: NEGATIVE mg/dL
Specific Gravity, Urine: 1.02 (ref 1.005–1.030)
UROBILINOGEN UA: 0.2 mg/dL (ref 0.0–1.0)
pH: 7 (ref 5.0–8.0)

## 2013-12-11 NOTE — Progress Notes (Signed)
NST performed today was reviewed and was found to be reactive.  Continue recommended antenatal testing and prenatal care. Blood suagrs are mostly within range; no abnormal fasting, one abnormal PP in B, one in lunch, two in dinner (after buffet and sweets as noted). Recommended continued diet adherence and will continue Glyburide. No other complaints or concerns.  Fetal movement and labor precautions reviewed.

## 2013-12-11 NOTE — Patient Instructions (Signed)
Return to clinic for any obstetric concerns or go to [facility] for evaluation  

## 2013-12-11 NOTE — Progress Notes (Signed)
Pt reports swelling in right nipple area that is painful

## 2013-12-11 NOTE — Progress Notes (Signed)
Pt states she has made dietary changes as we discussed on 6/18 and CBG readings are not as high. She reports still having some after lunch and dinner readings which are slightly elevated.

## 2013-12-14 ENCOUNTER — Ambulatory Visit (INDEPENDENT_AMBULATORY_CARE_PROVIDER_SITE_OTHER): Payer: No Typology Code available for payment source | Admitting: *Deleted

## 2013-12-14 VITALS — BP 104/71 | HR 88

## 2013-12-14 DIAGNOSIS — O24419 Gestational diabetes mellitus in pregnancy, unspecified control: Secondary | ICD-10-CM

## 2013-12-14 DIAGNOSIS — O99810 Abnormal glucose complicating pregnancy: Secondary | ICD-10-CM

## 2013-12-14 LAB — US OB FOLLOW UP

## 2013-12-14 NOTE — Progress Notes (Signed)
IOL scheduled 6/30 @ 0700.

## 2013-12-18 ENCOUNTER — Ambulatory Visit (INDEPENDENT_AMBULATORY_CARE_PROVIDER_SITE_OTHER): Payer: No Typology Code available for payment source | Admitting: Obstetrics & Gynecology

## 2013-12-18 VITALS — BP 104/68 | HR 86 | Wt 151.6 lb

## 2013-12-18 DIAGNOSIS — O24419 Gestational diabetes mellitus in pregnancy, unspecified control: Secondary | ICD-10-CM

## 2013-12-18 DIAGNOSIS — O409XX0 Polyhydramnios, unspecified trimester, not applicable or unspecified: Secondary | ICD-10-CM

## 2013-12-18 DIAGNOSIS — O403XX1 Polyhydramnios, third trimester, fetus 1: Secondary | ICD-10-CM

## 2013-12-18 DIAGNOSIS — O99810 Abnormal glucose complicating pregnancy: Secondary | ICD-10-CM

## 2013-12-18 DIAGNOSIS — O3090 Multiple gestation, unspecified, unspecified trimester: Secondary | ICD-10-CM

## 2013-12-18 LAB — COMPREHENSIVE METABOLIC PANEL
ALBUMIN: 3.3 g/dL — AB (ref 3.5–5.2)
ALT: 61 U/L — AB (ref 0–35)
AST: 31 U/L (ref 0–37)
Alkaline Phosphatase: 223 U/L — ABNORMAL HIGH (ref 39–117)
BUN: 11 mg/dL (ref 6–23)
CO2: 22 mEq/L (ref 19–32)
Calcium: 9.3 mg/dL (ref 8.4–10.5)
Chloride: 105 mEq/L (ref 96–112)
Creat: 0.63 mg/dL (ref 0.50–1.10)
Glucose, Bld: 104 mg/dL — ABNORMAL HIGH (ref 70–99)
POTASSIUM: 3.6 meq/L (ref 3.5–5.3)
Sodium: 136 mEq/L (ref 135–145)
TOTAL PROTEIN: 6.2 g/dL (ref 6.0–8.3)
Total Bilirubin: 0.4 mg/dL (ref 0.2–1.2)

## 2013-12-18 LAB — POCT URINALYSIS DIP (DEVICE)
BILIRUBIN URINE: NEGATIVE
Glucose, UA: NEGATIVE mg/dL
Hgb urine dipstick: NEGATIVE
KETONES UR: NEGATIVE mg/dL
NITRITE: NEGATIVE
Protein, ur: NEGATIVE mg/dL
Specific Gravity, Urine: 1.02 (ref 1.005–1.030)
Urobilinogen, UA: 0.2 mg/dL (ref 0.0–1.0)
pH: 6 (ref 5.0–8.0)

## 2013-12-18 LAB — CBC
HCT: 38 % (ref 36.0–46.0)
Hemoglobin: 13.2 g/dL (ref 12.0–15.0)
MCH: 30.3 pg (ref 26.0–34.0)
MCHC: 34.7 g/dL (ref 30.0–36.0)
MCV: 87.4 fL (ref 78.0–100.0)
PLATELETS: 207 10*3/uL (ref 150–400)
RBC: 4.35 MIL/uL (ref 3.87–5.11)
RDW: 14.8 % (ref 11.5–15.5)
WBC: 9.3 10*3/uL (ref 4.0–10.5)

## 2013-12-18 NOTE — Progress Notes (Signed)
Pt reports having severe itching all over her body and heartburn x3 days.  She states she has not slept at all for the past 3 nights because of the itching. US for growth scheduled on 6/25, IOL scheduled per protocol on 6/30.

## 2013-12-18 NOTE — Progress Notes (Signed)
NST reactive. FBS nl, PP occasionally>140 due to diet. Itching all over no rash , will do LFT and bile acids stat.

## 2013-12-18 NOTE — Patient Instructions (Signed)
Cholestasis of Pregnancy Cholestasis refers to any condition that causes the flow of the digestive fluid (bile) produced by your liver to slow or stop. Cholestasis of pregnancy is most common toward the end of pregnancy (thirdtrimester), but it can occur any time during your pregnancy. The condition often goes away soon after your baby is born.  Cholestasis may be uncomfortable but is usually harmless to you. However, it can be harmful to your baby. Cholestasis may increase the risk that your baby will be born too early (preterm delivery).  CAUSES  The cause of cholestasis of pregnancy is not known. Pregnancy hormones may affect the way your gallbladder functions. Your gallbladder normally holds the bile from your liver until you need it to help digest fat in your diet. Pregnancy hormones may cause the flow of bile to slow down and back up into your liver. Bile may then get into your bloodstream and cause cholestasis symptoms. RISK FACTORS You may be at increased risk if:  You had cholestasis during a previous pregnancy.  You have a family history of cholestasis.  You have liver problems.  You are having twins. SIGNS AND SYMPTOMS  The most common symptom of cholestasis of pregnancy is intense itching, especially on the palms of your hands and soles of your feet. The itching can spread to the rest of your body and is often worse at night. You will not usually have a rash. Other symptoms may include:   Feeling tired.   Yellowish discoloration of your skin and the whites of your eyes (jaundice).   Dark-colored urine.   Light-colored stools.  Poor appetite.  DIAGNOSIS  Your health care Jacqueline Bond will take your medical history and do a physical exam. You may have blood tests to check your liver function, bile level, and bilirubin level.  TREATMENT  Treatment is meant to make you more comfortable and keep your baby safe. Your health care Jacqueline Bond may prescribe medicine to relieve your  itching. The medicine used may also improve your blood test results and help keep your baby safe. Your health care Jacqueline Bond may also give you vitamin K before delivery to prevent excessive bleeding.  Your health care Jacqueline Bond may want to check your baby (fetal monitoring) frequently, as often as every 2 weeks. Once your baby's lungs have developed enough, your health care Jacqueline Bond may recommend starting (inducing) your labor and delivery by week 37 of your pregnancy. HOME CARE INSTRUCTIONS   Only use anti-itch creams and take medicines as directed by your health care Jacqueline Bond.  Take cool baths to soothe your itching.   Keep your fingernails short to prevent skin irritation from scratching.   Keep all your appointments for fetal monitoring. SEEK MEDICAL CARE IF:  Your symptoms get worse, even with treatment. SEEK IMMEDIATE MEDICAL CARE IF: You go into early labor at home. MAKE SURE YOU:  Understand these instructions.  Will watch your condition.  Will get help right away if you are not doing well or get worse. Document Released: 06/12/2000 Document Revised: 06/20/2013 Document Reviewed: 04/07/2013 [facility] Patient Information 2015 [employer] [place]. This information is not intended to replace advice given to you by your health care Jacqueline Bond. Make sure you discuss any questions you have with your health care Jacqueline Bond.

## 2013-12-19 ENCOUNTER — Telehealth: Payer: Self-pay | Admitting: *Deleted

## 2013-12-19 ENCOUNTER — Encounter (HOSPITAL_COMMUNITY): Payer: Self-pay | Admitting: *Deleted

## 2013-12-19 ENCOUNTER — Telehealth (HOSPITAL_COMMUNITY): Payer: Self-pay | Admitting: *Deleted

## 2013-12-19 NOTE — Telephone Encounter (Signed)
Preadmission screen  

## 2013-12-19 NOTE — Telephone Encounter (Signed)
Called pt regarding test results for bile acids as requested at her visit yesterday. She is very nervous and concerned. I informed pt that I have learned that it can take up to 3 days for the test to be completed. Therefore she should not be worried about not receiving a call sooner from our office. Pt stated that she has had some relief from the OTC benadryl which was recommended by Dr. Debroah LoopArnold yesterday. She wanted to know if there is anything she needs to do while waiting for test results. I told her no except that if she observes decreased FM she should come to the hospital and not wait for her scheduled visit. Pt voiced understanding.

## 2013-12-20 LAB — BILE ACIDS, TOTAL: BILE ACIDS TOTAL: 18 umol/L (ref 0–19)

## 2013-12-21 ENCOUNTER — Other Ambulatory Visit: Payer: No Typology Code available for payment source

## 2013-12-21 ENCOUNTER — Inpatient Hospital Stay (HOSPITAL_COMMUNITY)
Admission: AD | Admit: 2013-12-21 | Discharge: 2013-12-24 | DRG: 775 | Disposition: A | Discharge status: Home / Self Care | Payer: Medicaid Other | Source: Ambulatory Visit | Attending: Obstetrics & Gynecology | Admitting: Obstetrics & Gynecology

## 2013-12-21 ENCOUNTER — Ambulatory Visit (HOSPITAL_COMMUNITY)
Admission: RE | Admit: 2013-12-21 | Discharge: 2013-12-21 | Disposition: A | Discharge status: Home / Self Care | Payer: No Typology Code available for payment source | Source: Ambulatory Visit | Attending: Obstetrics & Gynecology | Admitting: Obstetrics & Gynecology

## 2013-12-21 ENCOUNTER — Encounter: Payer: Self-pay | Admitting: *Deleted

## 2013-12-21 ENCOUNTER — Encounter (HOSPITAL_COMMUNITY): Payer: Self-pay | Admitting: *Deleted

## 2013-12-21 DIAGNOSIS — O409XX0 Polyhydramnios, unspecified trimester, not applicable or unspecified: Secondary | ICD-10-CM | POA: Diagnosis present

## 2013-12-21 DIAGNOSIS — O99814 Abnormal glucose complicating childbirth: Secondary | ICD-10-CM | POA: Diagnosis present

## 2013-12-21 DIAGNOSIS — Z8249 Family history of ischemic heart disease and other diseases of the circulatory system: Secondary | ICD-10-CM

## 2013-12-21 DIAGNOSIS — O24419 Gestational diabetes mellitus in pregnancy, unspecified control: Secondary | ICD-10-CM

## 2013-12-21 DIAGNOSIS — Z823 Family history of stroke: Secondary | ICD-10-CM | POA: Diagnosis not present

## 2013-12-21 DIAGNOSIS — O411090 Infection of amniotic sac and membranes, unspecified, unspecified trimester, not applicable or unspecified: Secondary | ICD-10-CM | POA: Diagnosis present

## 2013-12-21 DIAGNOSIS — K838 Other specified diseases of biliary tract: Secondary | ICD-10-CM | POA: Diagnosis present

## 2013-12-21 DIAGNOSIS — F329 Major depressive disorder, single episode, unspecified: Secondary | ICD-10-CM | POA: Diagnosis present

## 2013-12-21 DIAGNOSIS — O26619 Liver and biliary tract disorders in pregnancy, unspecified trimester: Principal | ICD-10-CM | POA: Diagnosis present

## 2013-12-21 DIAGNOSIS — F3289 Other specified depressive episodes: Secondary | ICD-10-CM | POA: Diagnosis present

## 2013-12-21 DIAGNOSIS — Z3490 Encounter for supervision of normal pregnancy, unspecified, unspecified trimester: Secondary | ICD-10-CM

## 2013-12-21 DIAGNOSIS — O99344 Other mental disorders complicating childbirth: Secondary | ICD-10-CM | POA: Diagnosis present

## 2013-12-21 DIAGNOSIS — O99810 Abnormal glucose complicating pregnancy: Secondary | ICD-10-CM | POA: Insufficient documentation

## 2013-12-21 DIAGNOSIS — Z3689 Encounter for other specified antenatal screening: Secondary | ICD-10-CM | POA: Insufficient documentation

## 2013-12-21 HISTORY — DX: Intrahepatic cholestasis of pregnancy, unspecified trimester: O26.649

## 2013-12-21 HISTORY — DX: Obstruction of bile duct: K83.1

## 2013-12-21 HISTORY — DX: Liver and biliary tract disorders in pregnancy, unspecified trimester: O26.619

## 2013-12-21 LAB — GLUCOSE, CAPILLARY
GLUCOSE-CAPILLARY: 108 mg/dL — AB (ref 70–99)
Glucose-Capillary: 69 mg/dL — ABNORMAL LOW (ref 70–99)
Glucose-Capillary: 79 mg/dL (ref 70–99)

## 2013-12-21 LAB — CBC
HEMATOCRIT: 39.2 % (ref 36.0–46.0)
Hemoglobin: 13.3 g/dL (ref 12.0–15.0)
MCH: 30.9 pg (ref 26.0–34.0)
MCHC: 33.9 g/dL (ref 30.0–36.0)
MCV: 91 fL (ref 78.0–100.0)
PLATELETS: 186 10*3/uL (ref 150–400)
RBC: 4.31 MIL/uL (ref 3.87–5.11)
RDW: 15 % (ref 11.5–15.5)
WBC: 9 10*3/uL (ref 4.0–10.5)

## 2013-12-21 LAB — RPR

## 2013-12-21 MED ORDER — FLEET ENEMA 7-19 GM/118ML RE ENEM: 1.0000 | ENEMA | RECTAL | Status: DC | PRN

## 2013-12-21 MED ORDER — MISOPROSTOL 25 MCG QUARTER TABLET
50.0000 ug | ORAL_TABLET | ORAL | Status: DC
  Administered 2013-12-21: 50 ug via ORAL
  Filled 2013-12-21: qty 0.5

## 2013-12-21 MED ORDER — OXYCODONE-ACETAMINOPHEN 5-325 MG PO TABS: 1.0000 | ORAL_TABLET | ORAL | Status: DC | PRN

## 2013-12-21 MED ORDER — LACTATED RINGERS IV SOLN
INTRAVENOUS | Status: DC
  Administered 2013-12-21 (×2): via INTRAVENOUS

## 2013-12-21 MED ORDER — FENTANYL CITRATE 0.05 MG/ML IJ SOLN
INTRAMUSCULAR | Status: AC
Start: 2013-12-21 — End: 2013-12-22
  Filled 2013-12-21: qty 2

## 2013-12-21 MED ORDER — TERBUTALINE SULFATE 1 MG/ML IJ SOLN: 0.2500 mg | Freq: Once | INTRAMUSCULAR | Status: AC | PRN

## 2013-12-21 MED ORDER — ONDANSETRON HCL 4 MG/2ML IJ SOLN
4.0000 mg | Freq: Four times a day (QID) | INTRAMUSCULAR | Status: DC | PRN
  Administered 2013-12-22 (×2): 4 mg via INTRAVENOUS
  Filled 2013-12-21 (×2): qty 2

## 2013-12-21 MED ORDER — CITRIC ACID-SODIUM CITRATE 334-500 MG/5ML PO SOLN: 30.0000 mL | ORAL | Status: DC | PRN

## 2013-12-21 MED ORDER — OXYTOCIN BOLUS FROM INFUSION
500.0000 mL | INTRAVENOUS | Status: DC
  Administered 2013-12-22: 500 mL via INTRAVENOUS

## 2013-12-21 MED ORDER — FENTANYL CITRATE 0.05 MG/ML IJ SOLN
50.0000 ug | INTRAMUSCULAR | Status: DC | PRN
  Administered 2013-12-21 – 2013-12-22 (×5): 50 ug via INTRAVENOUS
  Filled 2013-12-21 (×4): qty 2

## 2013-12-21 MED ORDER — ACETAMINOPHEN 325 MG PO TABS
650.0000 mg | ORAL_TABLET | ORAL | Status: DC | PRN
  Administered 2013-12-22: 650 mg via ORAL
  Filled 2013-12-21: qty 2

## 2013-12-21 MED ORDER — LACTATED RINGERS IV SOLN: 500.0000 mL | INTRAVENOUS | Status: DC | PRN

## 2013-12-21 MED ORDER — LIDOCAINE HCL (PF) 1 % IJ SOLN
30.0000 mL | INTRAMUSCULAR | Status: DC | PRN
  Filled 2013-12-21: qty 30

## 2013-12-21 MED ORDER — IBUPROFEN 600 MG PO TABS: 600.0000 mg | ORAL_TABLET | Freq: Four times a day (QID) | ORAL | Status: DC | PRN

## 2013-12-21 MED ORDER — OXYTOCIN 40 UNITS IN LACTATED RINGERS INFUSION - SIMPLE MED
1.0000 m[IU]/min | INTRAVENOUS | Status: DC
  Administered 2013-12-22: 2 m[IU]/min via INTRAVENOUS
  Filled 2013-12-21: qty 1000

## 2013-12-21 MED ORDER — OXYTOCIN 40 UNITS IN LACTATED RINGERS INFUSION - SIMPLE MED
62.5000 mL/h | INTRAVENOUS | Status: DC
  Administered 2013-12-22: 62.5 mL/h via INTRAVENOUS

## 2013-12-21 NOTE — H&P (Signed)
LABOR ADMISSION HISTORY AND PHYSICAL  Yakima Catha GosselinBanerjee is a 33 y.o. female G2P0010 with IUP at 6817w3d presenting for IOL for cholestasis. Has been itching for the last couple weeks and bile salts returned today at 18. Some contractions but irregular. +Fm. No vb.   [facility]. On glyburide. Also with new found poly over the last few weeks.    Prenatal History/Complications:  Past Medical History: Past Medical History  Diagnosis Date  . Medical history non-contributory   . Depression 06/20/2013  . Gestational diabetes   . Asthma     no problems now    Past Surgical History: Past Surgical History  Procedure Laterality Date  . No past surgeries      Obstetrical History: OB History   Grav Para Term Preterm Abortions TAB SAB Ect Mult Living   2 0 0 0 1 0 1 0 0 0       Social History: History   Social History  . Marital Status: Married    Spouse Name: N/A    Number of Children: N/A  . Years of Education: N/A   Social History Main Topics  . Smoking status: Never Smoker   . Smokeless tobacco: Never Used  . Alcohol Use: No  . Drug Use: No  . Sexual Activity: Yes    Birth Control/ Protection: None   Other Topics Concern  . 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 Total of 16 years education.             Family History: Family History  Problem Relation Age of Onset  . Asthma Mother   . Hypertension Mother   . Hypertension Father   . Stroke Father     Allergies: No Known Allergies  Prescriptions prior to admission  Medication Sig Dispense Refill  . DiphenhydrAMINE HCl (BENADRYL PO) Take 2 tablets by mouth at bedtime as needed (itching).      Marland Kitchen. glyBURIDE (DIABETA) 1.25 MG tablet Take 1 tablet (1.25 mg total) by mouth 2 (two) times daily. Before breakfast and  at bedtime  60 tablet  3  . Prenatal Vit-Fe Fumarate-FA (MULTIVITAMIN-PRENATAL) 27-0.8 MG TABS tablet Take 1 tablet by mouth daily at 12 noon.      Marland Kitchen. ACCU-CHEK FASTCLIX LANCETS MISC 1 Units by Percutaneous route 4 (four) times daily.  100 each  12     Review of Systems   All systems reviewed and negative except as stated in HPI  Blood pressure 102/56, pulse 77, temperature 97.7 F (36.5 C), temperature source Oral, resp. rate 18, height 5\' 2"  (1.575 m), weight 68.748 kg (151 lb 9 oz), last menstrual period 03/27/2013. General appearance: alert, cooperative and no distress Lungs: clear to auscultation bilaterally Heart: regular rate and rhythm Abdomen: soft, non-tender; bowel sounds normal Extremities: Homans sign is negative, no sign of DVT  Presentation: cephalic Fetal monitoringBaseline: 125 bpm, Variability: Good {> 6 bpm), Accelerations: Reactive and Decelerations: Absent Uterine activity contractions every 2-85min   Dilation: 1 Effacement (%): 40 Station: -2 Exam by:: Dr. Reola CalkinsBeck   Prenatal labs: ABO, Rh: AB/POS/-- (12/16 1406) Antibody: NEG (12/16 1406) Rubella:   RPR: NON REAC (04/08 1531)  HBsAg: NEGATIVE (12/16 1406)  HIV: NON-REACTIVE (04/08 1531)  GBS: Negative (06/08 0000)  1  hr Glucola 222 Genetic screening  Declined  Anatomy US normal, poly    Results for orders placed during the hospital encounter of 12/21/13 (from the past 24 hour(s))  CBC   Collection Time    12/21/13  1:25 PM      Result Value Ref Range   WBC 9.0  4.0 - 10.5 K/uL   RBC 4.31  3.87 - 5.11 MIL/uL   Hemoglobin 13.3  12.0 - 15.0 g/dL   HCT 16.139.2  09.636.0 - 04.546.0 %   MCV 91.0  78.0 - 100.0 fL   MCH 30.9  26.0 - 34.0 pg   MCHC 33.9  30.0 - 36.0 g/dL   RDW 40.915.0  81.111.5 - 91.415.5 %   Platelets 186  150 - 400 K/uL    Assessment: Annelie Catha GosselinBanerjee is a 33 y.o. G2P0010 at 5859w3d here for IOL for cholestasis.   #Labor: unfavorable cervix. Plan for ripening with cytotec.  #Pain: No meds currently.  Epidural in more active labor #FWB: Cat I tracing, efw 7lb #ID:  gbs neg #MOF: breast #MOC:undecided #Circ:  No   BECK, KELI L 12/21/2013, 3:14 PM

## 2013-12-21 NOTE — Progress Notes (Signed)
   Jacqueline Bond is a 33 y.o. G2P0010 at 3378w3d  admitted for induction of labor due to cholestasis.  Subjective:  Feels "very crampy" Objective: Filed Vitals:   12/21/13 1430 12/21/13 1502 12/21/13 1859 12/21/13 2038  BP: 112/74 102/56 102/60 95/62  Pulse: 78 77 88 75  Temp:   98.4 F (36.9 C) 98.3 F (36.8 C)  TempSrc:   Oral Oral  Resp: 18 18 18 16   Height:      Weight:          FHT:  FHR: 140 bpm, variability: moderate,  accelerations:  Present,  decelerations:  Absent UC:   irregular, every 2-5 minutes SVE: deferred; foley still in    Labs: Lab Results  Component Value Date   WBC 9.0 12/21/2013   HGB 13.3 12/21/2013   HCT 39.2 12/21/2013   MCV 91.0 12/21/2013   PLT 186 12/21/2013    Assessment / Plan: IOL for cholestasis, ripening phase Bathtub for pain relief Labor: ripening phase Fetal Wellbeing:  Category I Pain Control:  Fentanyl Anticipated MOD:  NSVD  CRESENZO-DISHMAN,FRANCES 12/21/2013, 10:35 PM

## 2013-12-21 NOTE — Progress Notes (Signed)
Hypoglycemic Event  CBG: 69  Treatment: 15 GM carbohydrate snack  Symptoms: None  Follow-up CBG: Time:1427 CBG Result:79  Possible Reasons for Event: Unknown  Comments/MD notified:Dr. Jordan LikesSchmitz notified    [name], [name]  Remember to initiate Hypoglycemia Order Set & completeAdult Hypoglycemia Protocol Treatment Guidelines  1. RN shall initiate Hypoglycemia Protocol emergency measures immediately when:            w        Routine or STAT CBG and/or a lab glucose indicates hypoglycemia (CBG < 70 mg/dl)  2. Treat the patient according to ability to take PO's and severity of hypoglycemia.   3. If patient is on GlucoStabilizer, follow directions provided by the [facility] for hypoglycemic events.  4. If patient on insulin pump, follow Hypoglycemia Protocol.  If patient requires more than one treatment have patient place pump in SUSPEND and notify MD.  DO NOT leave pump in SUSPEND for greater than 30 minutes unless ordered by MD.  A. Treatment for Mild or Moderate-Patient cooperative and able to swallow    1.  Patient taking PO's and can cooperate   a.  Give one of the following 15 gram CHO options:                           w     1 tube oral dextrose gel                           w     3-4 Glucose tablets                           w     4 oz. Juice                           w     4 oz. regular soda                                    ESRD patients:  clear, regular soda                           w     8 oz. skim milk    b.  Recheck CBG in 15 minutes after treatment                            w       If CBG < 70 mg/dl, repeat treatment and recheck until hypoglycemia is resolved                            w       If CBG > 70 mg/dl and next meal is more than 1 hour away, give additional 15 grams CHO   2.  Patient NPO-Patient cooperative and no altered mental status    a.  Give 25 ml of D50 IV.   b.  Recheck CBG in 15 minutes after treatment.                             w  If CBG is less than 70 mg/dl, repeat treatment and recheck until hypoglycemia is resolved.   c.  Notify MD for further orders.             SPECIAL CONSIDERATIONS:    a.  If no IV access,                              w        Start IV of D5W at [facility]                             w        Give 25 ml of D50 IV.    b.  If unable to gain IV access                             w          Give Glucagon IM:     i.  1 mg if patient weighs more than 45.5 kg     ii.  0.5 mg if patient weighs less than 45.5 kg   c.  Notify MD for further orders  B. Treatment for Severe-- Patient unconscious or unable to take PO's safely    1.  Position patient on side   2.  Give 50 ml D50 IV   3.  Recheck CBG in 15 minutes.                    w      If CBG is less than 70 mg/dl, repeat treatment and recheck until hypoglycemia is resolved.   4.  Notify MD for further orders.    SPECIAL CONSIDERATIONS:    a.  If no IV access                              w     Give Glucagon IM                                              i.  1 mg if patient weighs more than 45.5 kg                                             ii.  0.5 mg if patient weighs less than 45.5 kg                              w      Start IV of D5W at 50 ml/hr and give 50 ml D50 IV   b.  If no IV access and active seizure                               w       Call Rapid Response   c.  If unable to gain IV access, give Glucagon IM:  w          1 mg if patient weighs more than 45.5 kg                              w          0.5 mg if patient weighs less than 45.5 kg   d.  Notify MD for further orders.  C. Complete smart text progress note to document intervention and follow-up CBG   1. In [facility] patient chart, click on Notes (left side of screen)   2. Create Progress Note   3. Click on [employer].  In the Match box type "hypo" and enter    4. Double click on CHL IP HYPOGLYCEMIC EVENT and enter data   5. MD  must be notified if patient is NPO or experienced severe hypoglycemia

## 2013-12-21 NOTE — Progress Notes (Signed)
   Gus HeightBidita Hanna is a 33 y.o. G2P0010 at 7633w3d  admitted for induction of labor due to cholestasis. Also has A2DM and polyhydramnios.  Subjective:  Feels fine Objective: Filed Vitals:   12/21/13 1301 12/21/13 1308 12/21/13 1430 12/21/13 1502  BP:  113/70 112/74 102/56  Pulse:  94 78 77  Temp:  97.7 F (36.5 C)    TempSrc:  Oral    Resp:  18 18 18   Height: 5\' 2"  (1.575 m)     Weight: 68.748 kg (151 lb 9 oz)         FHT:  FHR: 150 bpm, variability: moderate,  accelerations:  Present,  decelerations:  Absent UC:   none SVE:   1/50/-2 Foley inserted and inflated with 60cc H20  Labs: Lab Results  Component Value Date   WBC 9.0 12/21/2013   HGB 13.3 12/21/2013   HCT 39.2 12/21/2013   MCV 91.0 12/21/2013   PLT 186 12/21/2013    Assessment / Plan: IOL for cholestasis; ripening phase Pitocin when foley falls out Labor: no Fetal Wellbeing:  Category I Pain Control:  Labor support without medications Anticipated MOD:  NSVD  CRESENZO-DISHMAN,FRANCES 12/21/2013, 6:19 PM

## 2013-12-21 NOTE — Progress Notes (Signed)
Dr. Debroah LoopArnold notified of bile acid results earlier this morning @ 0830 and order received for pt to be admitted today for IOL. I called pt's home at that time and left message to call back but did not receive a return call. Pt arrived for scheduled NST following US today. I explained to her and her family that the results of her lab test from 6/22 indicates she has cholestasis. This condition as discussed on 6/22 by Dr. Debroah LoopArnold requires delivery of infant and for that reason she will need to be admitted today for IOL. Pt and her family members were given the opportunity to ask questions. They voiced understanding of the plan of care. Pt will go home to gather her belongings and return to hospital in approximately one hour. Report called to Dustin FlockBobbie Jo @ [address].

## 2013-12-21 NOTE — Progress Notes (Addendum)
Gus HeightBidita Rathbun is a 33 y.o. G2P0010 at 267w3d by LMP admitted for induction of labor due to cholestasis.  Subjective: Feeling slight uterine contractions, No complaints.   Objective: BP 102/56  Pulse 77  Temp(Src) 97.7 F (36.5 C) (Oral)  Resp 18  Ht 5\' 2"  (1.575 m)  Wt 151 lb 9 oz (68.748 kg)  BMI 27.71 kg/m2  LMP 03/27/2013      FHT:  FHR: 140 bpm, variability: moderate,  accelerations:  Present,  decelerations:  Absent UC:   irreg q 2-5 min SVE:   Dilation: 1 Effacement (%): 40 Station: -2 Exam by:: Dr. Reola CalkinsBeck  Labs: Lab Results  Component Value Date   WBC 9.0 12/21/2013   HGB 13.3 12/21/2013   HCT 39.2 12/21/2013   MCV 91.0 12/21/2013   PLT 186 12/21/2013    Assessment / Plan: Induction of labor due to cholestasis,  progressing well on pitocin , also has A2 DM on glyburide OP  Labor: Progressing normally, Likely will have foley bulb placed at next check Preeclampsia:  no signs or symptoms of toxicity Fetal Wellbeing:  Category I Pain Control:  Labor support without medications and then epidural in active labor I/D:  GBS neg Anticipated MOD:  NSVD A2DM: Q4 CBGs, no meds for now.   Kevin FentonBradshaw, Melonie Germani 12/21/2013, 5:07 PM

## 2013-12-22 ENCOUNTER — Encounter (HOSPITAL_COMMUNITY): Payer: Medicaid Other | Admitting: Anesthesiology

## 2013-12-22 ENCOUNTER — Encounter (HOSPITAL_COMMUNITY): Payer: Self-pay | Admitting: *Deleted

## 2013-12-22 ENCOUNTER — Inpatient Hospital Stay (HOSPITAL_COMMUNITY): Payer: Medicaid Other | Admitting: Anesthesiology

## 2013-12-22 DIAGNOSIS — O409XX0 Polyhydramnios, unspecified trimester, not applicable or unspecified: Secondary | ICD-10-CM

## 2013-12-22 DIAGNOSIS — K838 Other specified diseases of biliary tract: Secondary | ICD-10-CM

## 2013-12-22 DIAGNOSIS — O26619 Liver and biliary tract disorders in pregnancy, unspecified trimester: Secondary | ICD-10-CM

## 2013-12-22 DIAGNOSIS — O411090 Infection of amniotic sac and membranes, unspecified, unspecified trimester, not applicable or unspecified: Secondary | ICD-10-CM

## 2013-12-22 LAB — GLUCOSE, CAPILLARY
GLUCOSE-CAPILLARY: 140 mg/dL — AB (ref 70–99)
GLUCOSE-CAPILLARY: 107 mg/dL — AB (ref 70–99)
GLUCOSE-CAPILLARY: 96 mg/dL (ref 70–99)
GLUCOSE-CAPILLARY: 81 mg/dL (ref 70–99)
GLUCOSE-CAPILLARY: 91 mg/dL (ref 70–99)
Glucose-Capillary: 87 mg/dL (ref 70–99)
Glucose-Capillary: 90 mg/dL (ref 70–99)
Glucose-Capillary: 79 mg/dL (ref 70–99)

## 2013-12-22 MED ORDER — AMPICILLIN SODIUM 2 G IJ SOLR
2.0000 g | Freq: Four times a day (QID) | INTRAMUSCULAR | Status: DC
  Administered 2013-12-22: 2 g via INTRAVENOUS
  Filled 2013-12-22 (×4): qty 2000

## 2013-12-22 MED ORDER — FENTANYL 2.5 MCG/ML BUPIVACAINE 1/10 % EPIDURAL INFUSION (WH - ANES)
14.0000 mL/h | INTRAMUSCULAR | Status: DC | PRN
  Administered 2013-12-22 (×2): 14 mL/h via EPIDURAL
  Filled 2013-12-22: qty 125

## 2013-12-22 MED ORDER — PHENYLEPHRINE 40 MCG/ML (10ML) SYRINGE FOR IV PUSH (FOR BLOOD PRESSURE SUPPORT)
PREFILLED_SYRINGE | INTRAVENOUS | Status: AC
  Filled 2013-12-22: qty 10

## 2013-12-22 MED ORDER — FENTANYL 2.5 MCG/ML BUPIVACAINE 1/10 % EPIDURAL INFUSION (WH - ANES)
INTRAMUSCULAR | Status: AC
  Filled 2013-12-22: qty 125

## 2013-12-22 MED ORDER — GENTAMICIN SULFATE 40 MG/ML IJ SOLN
140.0000 mg | Freq: Three times a day (TID) | INTRAVENOUS | Status: DC
  Administered 2013-12-22: 140 mg via INTRAVENOUS
  Filled 2013-12-22 (×3): qty 3.5

## 2013-12-22 MED ORDER — EPHEDRINE 5 MG/ML INJ: 10.0000 mg | INTRAVENOUS | Status: DC | PRN

## 2013-12-22 MED ORDER — LACTATED RINGERS IV SOLN: 500.0000 mL | Freq: Once | INTRAVENOUS | Status: DC

## 2013-12-22 MED ORDER — EPHEDRINE 5 MG/ML INJ
INTRAVENOUS | Status: AC
  Filled 2013-12-22: qty 4

## 2013-12-22 MED ORDER — PHENYLEPHRINE 40 MCG/ML (10ML) SYRINGE FOR IV PUSH (FOR BLOOD PRESSURE SUPPORT): 80.0000 ug | PREFILLED_SYRINGE | INTRAVENOUS | Status: DC | PRN

## 2013-12-22 MED ORDER — ONDANSETRON HCL 4 MG/2ML IJ SOLN
4.0000 mg | INTRAMUSCULAR | Status: DC | PRN
  Administered 2013-12-22: 4 mg via INTRAVENOUS
  Filled 2013-12-22: qty 2

## 2013-12-22 MED ORDER — LIDOCAINE HCL (PF) 1 % IJ SOLN
INTRAMUSCULAR | Status: DC | PRN
  Administered 2013-12-22 (×4): 4 mL

## 2013-12-22 MED ORDER — DIPHENHYDRAMINE HCL 50 MG/ML IJ SOLN: 12.5000 mg | INTRAMUSCULAR | Status: DC | PRN

## 2013-12-22 NOTE — Progress Notes (Signed)
   Jacqueline Bond is a 33 y.o. G2P0010 at 4538w4d  admitted for induction of labor due to cholestasis; A2DM/polyhydramnios.  Subjective: Having some mild contractions  Objective: Filed Vitals:   12/22/13 0326 12/22/13 0522 12/22/13 0630 12/22/13 0738  BP:  114/78 104/73 101/69  Pulse:  93 100 86  Temp: 98.5 F (36.9 C)  98 F (36.7 C) 98.4 F (36.9 C)  TempSrc: Oral  Oral   Resp: 18   16  Height:      Weight:          FHT:  FHR: 130 bpm, variability: moderate,  accelerations:  Present,  decelerations:  Absent UC:   irregular, every 3-4 minutes SVE:   Dilation: 4 Effacement (%): Thick Station: -3 Exam by:: ansah-mensah, rnc Pitocin @ 4 mu/min  Labs: Lab Results  Component Value Date   WBC 9.0 12/21/2013   HGB 13.3 12/21/2013   HCT 39.2 12/21/2013   MCV 91.0 12/21/2013   PLT 186 12/21/2013   Blood Sugar 87  Assessment / Plan: IOL for cholestasis, ripening phase complete Pitocin until adequate labor Blood sugars q 2 hours Labor: early Fetal Wellbeing:  Category I Pain Control:  Fentanyl, planning epidural for active labor. Anticipated MOD:  NSVD  Kevin FentonBradshaw, Samuel 12/22/2013, 8:09 AM

## 2013-12-22 NOTE — Progress Notes (Signed)
Jacqueline Bond is a 33 y.o. G2P0010 at 6721w4d admitted for induction of labor due to cholestasis  Subjective: Pushing, feeling pressure. Wants a few minutes of rest.   Objective: BP 104/62  Pulse 90  Temp(Src) 100.6 F (38.1 C) (Axillary)  Resp 16  Ht 5\' 2"  (1.575 m)  Wt 68.748 kg (151 lb 9 oz)  BMI 27.71 kg/m2  SpO2 98%  LMP 03/27/2013      FHT:  FHR: 130 bpm, variability: moderate,  accelerations:  Present,  decelerations:  Absent UC:   Regular, q 2-3 min SVE:   Dilation: 10 Effacement (%): 100 Station: + 3 Exam by:: Martasia Talamante  Labs: Lab Results  Component Value Date   WBC 9.0 12/21/2013   HGB 13.3 12/21/2013   HCT 39.2 12/21/2013   MCV 91.0 12/21/2013   PLT 186 12/21/2013    Assessment / Plan: IOL for cholestasis  Labor: progressing normally, pushing well, pt to rest for a few minutes and return to push.  Fetal Wellbeing:  Category I Pain Control:  Epidural Anticipated MOD:  NSVD ID: Temp of 100.6, Treat presumptively for chorio with amp and gent.   Kevin FentonBradshaw, Latayvia Mandujano 12/22/2013, 9:25 PM

## 2013-12-22 NOTE — Progress Notes (Signed)
   Gus HeightBidita Gundry is a 33 y.o. G2P0010 at 2569w4d  admitted for induction of labor due to cholestasis.  A2DM/polyhydramnios.  Subjective:  sleeping Objective: Filed Vitals:   12/21/13 1430 12/21/13 1502 12/21/13 1859 12/21/13 2038  BP: 112/74 102/56 102/60 95/62  Pulse: 78 77 88 75  Temp:   98.4 F (36.9 C) 98.3 F (36.8 C)  TempSrc:   Oral Oral  Resp: 18 18 18 16   Height:      Weight:          FHT:  FHR: 125 bpm, variability: moderate,  accelerations:  Present,  decelerations:  Absent UC:   irregular, every 2-7 minutes SVE:   Foley still in   Labs: Lab Results  Component Value Date   WBC 9.0 12/21/2013   HGB 13.3 12/21/2013   HCT 39.2 12/21/2013   MCV 91.0 12/21/2013   PLT 186 12/21/2013    Assessment / Plan: IOL for cholestasis, ripening phase Will start pitocin when foley falls out Labor: no Fetal Wellbeing:  Category I Pain Control:  Fentanyl Anticipated MOD:  NSVD  CRESENZO-DISHMAN,FRANCES 12/22/2013, 1:31 AM

## 2013-12-22 NOTE — Progress Notes (Signed)
Gus HeightBidita Rupert is a 33 y.o. G2P0010 at 5669w4d admitted for induction of labor due to cholestasis.  Subjective: Nausea, feeling much less pain with the epidural.   Objective: BP 104/71  Pulse 68  Temp(Src) 98.4 F (36.9 C) (Oral)  Resp 16  Ht 5\' 2"  (1.575 m)  Wt 68.748 kg (151 lb 9 oz)  BMI 27.71 kg/m2  SpO2 98%  LMP 03/27/2013     FHT:  FHR: 135 bpm, variability: moderate,  accelerations:  Present,  decelerations:  Absent UC:   irreg q 2-3 min SVE:   Dilation: 5 Effacement (%): 60 Station: -2 Exam by:: bradshaw Pitocin: 18 milli-units/min   Labs: Lab Results  Component Value Date   WBC 9.0 12/21/2013   HGB 13.3 12/21/2013   HCT 39.2 12/21/2013   MCV 91.0 12/21/2013   PLT 186 12/21/2013    Assessment / Plan: IOL for cholestasis   Labor: on pitocin. slow progress but early labor. IUPC placed for slow progress to help titrate pit Fetal Wellbeing:  Category I Pain Control:  Epidural I/D:  n/a Anticipated MOD:  NSVD  Kevin FentonBradshaw, Samuel 12/22/2013, 3:03 PM  I have seen and examined this patient and agree with above documentation in the resident's note.   Rulon AbideKeli Beck, M.D. [facility] 12/22/2013 3:12 PM

## 2013-12-22 NOTE — Progress Notes (Signed)
   Jacqueline Bond is a 33 y.o. G2P0010 at 6162w4d  admitted for induction of labor due to cholestasis; A2DM/polyhydramnios.  Subjective:  Mild contractions Objective: Filed Vitals:   12/22/13 0104 12/22/13 0326 12/22/13 0522 12/22/13 0630  BP: 102/72  114/78 104/73  Pulse: 91  93 100  Temp:  98.5 F (36.9 C)  98 F (36.7 C)  TempSrc:  Oral  Oral  Resp: 18 18    Height:      Weight:          FHT:  FHR: 130 bpm, variability: moderate,  accelerations:  Present,  decelerations:  Absent UC:   irregular, every 2-4 minutes SVE:   Dilation: 4 Effacement (%): Thick Station: -3 Exam by:: ansah-mensah, rnc Pitocin @ 2 mu/min Foley fell out spontaneously 30 minutes ago Labs: Lab Results  Component Value Date   WBC 9.0 12/21/2013   HGB 13.3 12/21/2013   HCT 39.2 12/21/2013   MCV 91.0 12/21/2013   PLT 186 12/21/2013   Blood Sugar 87  Assessment / Plan: IOL for cholestasis, ripening phase complete Pitocin until adequate labor Blood sugars q 2 hours now Labor: early Fetal Wellbeing:  Category I Pain Control:  Fentanyl Anticipated MOD:  NSVD  CRESENZO-DISHMAN,FRANCES 12/22/2013, 7:15 AM

## 2013-12-22 NOTE — Progress Notes (Signed)
Jacqueline Bond is a 33 y.o. G2P0010 at 4158w4d admitted for induction of labor due to cholestasis  Subjective: Feeling increasing pressure.   Objective: BP 117/54  Pulse 77  Temp(Src) 98.7 F (37.1 C) (Axillary)  Resp 16  Ht 5\' 2"  (1.575 m)  Wt 68.748 kg (151 lb 9 oz)  BMI 27.71 kg/m2  SpO2 98%  LMP 03/27/2013      FHT:  FHR: 125 bpm, variability: minimal ,  accelerations:  Present,  decelerations:  Absent UC:   irregular, every 2-3 minutes SVE:   Dilation: 10 Effacement (%): 100 Station: + 1 Exam by:: Bradshaw  Labs: Lab Results  Component Value Date   WBC 9.0 12/21/2013   HGB 13.3 12/21/2013   HCT 39.2 12/21/2013   MCV 91.0 12/21/2013   PLT 186 12/21/2013    Assessment / Plan: IOL for cholestasis  Labor: Progressing normally, complete, labor for short time then trial of pushing Fetal Wellbeing:  Category II Pain Control:  Epidural Anticipated MOD:  NSVD  Kevin FentonBradshaw, Samuel 12/22/2013, 7:39 PM

## 2013-12-22 NOTE — Progress Notes (Signed)
Jacqueline Bond is a [age] y.o. G2P0010 at 9326w4d admitted for induction of labor due to cholestasis.  Subjective:   Objective: BP 110/65  Pulse 88  Temp(Src) 98.4 F (36.9 C) (Oral)  Resp 16  Ht 5\' 2"  (1.575 m)  Wt 68.748 kg (151 lb 9 oz)  BMI 27.71 kg/m2  LMP 03/27/2013     FHT:  FHR: 135 bpm, variability: moderate,  accelerations:  Present,  decelerations:  Absent UC:   none SVE:   Dilation: 4 Effacement (%): 50 Station: -2 Exam by:: beck  Labs: Lab Results  Component Value Date   WBC 9.0 12/21/2013   HGB 13.3 12/21/2013   HCT 39.2 12/21/2013   MCV 91.0 12/21/2013   PLT 186 12/21/2013    Assessment / Plan: IOL for cholestasis   Labor: on pitocin. slow progress but early labor. AROM performed with return of clear fluid Fetal Wellbeing:  Category I Pain Control:  Fentanyl I/D:  n/a Anticipated MOD:  NSVD  BECK, KELI L 12/22/2013, 12:24 PM

## 2013-12-22 NOTE — Anesthesia Procedure Notes (Signed)
Epidural Patient location during procedure: OB Start time: 12/22/2013 1:22 PM  Staffing Performed by: anesthesiologist   Preanesthetic Checklist Completed: patient identified, site marked, surgical consent, pre-op evaluation, timeout performed, IV checked, risks and benefits discussed and monitors and equipment checked  Epidural Patient position: sitting Prep: site prepped and draped and DuraPrep Patient monitoring: continuous pulse ox and blood pressure Approach: midline Injection technique: LOR air  Needle:  Needle type: Tuohy  Needle gauge: 17 G Needle length: 9 cm and 9 Needle insertion depth: 4 cm Catheter type: closed end flexible Catheter size: 19 Gauge Catheter at skin depth: 9 cm Test dose: negative  Assessment Events: blood not aspirated, injection not painful, no injection resistance, negative IV test and no paresthesia  Additional Notes Discussed risk of headache, infection, bleeding, nerve injury and failed or incomplete block.  Patient voices understanding and wishes to proceed.  Epidural placed easily on first attempt.  No paresthesia.  Patient tolerated procedure well with no apparent complications.  Jasmine DecemberA. Justyn Boyson, MD

## 2013-12-22 NOTE — Progress Notes (Signed)
Jacqueline Bond is a 33 y.o. G2P0010 with IUP at 2749w4d admitted for induction of labor due to cholestasis. A2DM/polyhydramnios.   Subjective: Pt feeling nauseous. Increased pain with contractions.   Objective: BP 110/63  Pulse 84  Temp(Src) 98.4 F (36.9 C) (Oral)  Resp 16  Ht 5\' 2"  (1.575 m)  Wt 68.748 kg (151 lb 9 oz)  BMI 27.71 kg/m2  LMP 03/27/2013     FHT:  FHR: 130 bpm, variability: moderate,  accelerations:  Present,  decelerations:  Absent UC:   irregular, every 3-4 minutes SVE:   Dilation: 4 Effacement (%): Thick Station: -3 Exam by:: ansah-mensah, rnc  Labs: Lab Results  Component Value Date   WBC 9.0 12/21/2013   HGB 13.3 12/21/2013   HCT 39.2 12/21/2013   MCV 91.0 12/21/2013   PLT 186 12/21/2013    Assessment / Plan: IOL for cholestasis Pitocin until adequate labor  Labor: Progressing adequately on Pit Fetal Wellbeing:  Category I Pain Control:  Fentanyl, planning epidural for active labor Anticipated MOD:  NSVD  Bing PlumeGervasi, Kristin E 12/22/2013, 10:04 AM  I have seen and examined this patient and agree with above documentation in the PA student's note.   Rulon AbideKeli Beck, M.D. [facility] Fellow 12/22/2013 10:22 AM

## 2013-12-22 NOTE — Progress Notes (Signed)
ANTIBIOTIC CONSULT NOTE - INITIAL  Pharmacy Consult for Gentamicin Indication: Chorioamnionitis/ Maternal temp  No Known Allergies  Patient Measurements: Height: 5\' 2"  (157.5 cm) Weight: 151 lb 9 oz (68.748 kg) IBW/kg (Calculated) : 50.1 Adjusted Body Weight: 55.7kg  Vital Signs: Temp: 100.6 F (38.1 C) (06/26 2116) Temp src: Axillary (06/26 2116) BP: 104/62 mmHg (06/26 2031) Pulse Rate: 90 (06/26 2031) Intake/Output from previous day:   Intake/Output from this shift:    Labs:  Recent Labs  12/21/13 1325  WBC 9.0  HGB 13.3  PLT 186   Estimated Creatinine Clearance: 90.8 ml/min (by C-G formula based on Cr of 0.63). No results found for this basename: VANCOTROUGH, Leodis BinetVANCOPEAK, VANCORANDOM, GENTTROUGH, GENTPEAK, GENTRANDOM, TOBRATROUGH, TOBRAPEAK, TOBRARND, AMIKACINPEAK, AMIKACINTROU, AMIKACIN,  in the last 72 hours   Microbiology: Recent Results (from the past 720 hour(s))  OB RESULTS CONSOLE GBS     Status: None   Collection Time    12/04/13 12:00 AM      Result Value Ref Range Status   GBS Negative   Final  CULTURE, BETA STREP (GROUP B ONLY)     Status: None   Collection Time    12/04/13 11:33 AM      Result Value Ref Range Status   Organism ID, Bacteria NO GROUP B STREP (S.AGALACTIAE) ISOLATED   Final  GC/CHLAMYDIA PROBE AMP     Status: None   Collection Time    12/04/13 11:33 AM      Result Value Ref Range Status   CT Probe RNA NEGATIVE   Final   GC Probe RNA NEGATIVE   Final   Comment:                                                                                             **Normal Reference Range: Negative**                 Assay performed using the Gen-Probe APTIMA COMBO2 (R) Assay.           Acceptable specimen types for this assay include APTIMA Swabs (Unisex,     endocervical, urethral, or vaginal), first void urine, and ThinPrep     liquid based cytology samples.    Medical History: Past Medical History  Diagnosis Date  . Medical history  non-contributory   . Depression 06/20/2013  . Gestational diabetes   . Asthma     no problems now  . Maternal cholestasis of pregnancy     Medications:  Ampicillin 2 gram IV q6h Assessment: 33yo F admitted for IOL due to maternal cholestasis. Pt has now developed maternal temp during labor so Ampicillin and Gentamicin initiated to r/o chorioamnionitis.  Goal of Therapy:  Gentamicin peaks 6-678mcg/ml and trough < 691mcg/ml  Plan:  1. Gentamicin 140mg  IV q8h. 2. Will continue to follow and assess need for further kinetic workup based on duration and pt's clinical status.  Thanks!  Claybon Jabsngel, Andrea G 12/22/2013,9:51 PM

## 2013-12-22 NOTE — Progress Notes (Signed)
Jacqueline HeightBidita Bond is a 33 y.o. G2P0010 at 6553w4d admitted for induction of labor due to cholestasis  Subjective: Feeling more comfortable with epidural.   Objective: BP 103/58  Pulse 76  Temp(Src) 98.6 F (37 C) (Oral)  Resp 16  Ht 5\' 2"  (1.575 m)  Wt 68.748 kg (151 lb 9 oz)  BMI 27.71 kg/m2  SpO2 98%  LMP 03/27/2013      FHT:  FHR: 130 bpm, variability: moderate,  accelerations:  Present,  decelerations:  Absent UC:   irregular, every 2-3 minutes SVE:   Dilation: 7 Effacement (%): 100 Station: -1 Exam by:: beck  Labs: Lab Results  Component Value Date   WBC 9.0 12/21/2013   HGB 13.3 12/21/2013   HCT 39.2 12/21/2013   MCV 91.0 12/21/2013   PLT 186 12/21/2013    Assessment / Plan: IOL for cholestasis  Labor: Progressing on Pit. IUPC removed as was not functioning and not replaced as cervical change was occuring.  Fetal Wellbeing:  Category I Pain Control:  Epidural Anticipated MOD:  NSVD  Jacqueline PlumeGervasi, Jacqueline Bond 12/22/2013, 5:57 PM  I have seen and examined this patient and agree with above documentation in the PA student's note.   Rulon AbideKeli Beck, M.D. [facility] 12/22/2013 6:08 PM

## 2013-12-22 NOTE — Anesthesia Preprocedure Evaluation (Addendum)
Anesthesia Evaluation  Patient identified by MRN, date of birth, ID band Patient awake    Reviewed: Allergy & Precautions, H&P , NPO status , Patient's Chart, lab work & pertinent test results, reviewed documented beta blocker date and time   History of Anesthesia Complications Negative for: history of anesthetic complications  Airway Mallampati: II TM Distance: >3 FB Neck ROM: full    Dental  (+) Teeth Intact   Pulmonary asthma ,  breath sounds clear to auscultation        Cardiovascular negative cardio ROS  Rhythm:regular Rate:Normal     Neuro/Psych negative neurological ROS  negative psych ROS   GI/Hepatic negative GI ROS, Cholestasis of pregnancy   Endo/Other  diabetes, Gestational, Oral Hypoglycemic Agents  Renal/GU negative Renal ROS     Musculoskeletal   Abdominal   Peds  Hematology negative hematology ROS (+)   Anesthesia Other Findings   Reproductive/Obstetrics (+) Pregnancy                          Anesthesia Physical Anesthesia Plan  ASA: II  Anesthesia Plan: Epidural   Post-op Pain Management:    Induction:   Airway Management Planned:   Additional Equipment:   Intra-op Plan:   Post-operative Plan:   Informed Consent: I have reviewed the patients History and Physical, chart, labs and discussed the procedure including the risks, benefits and alternatives for the proposed anesthesia with the patient or authorized representative who has indicated his/her understanding and acceptance.     Plan Discussed with:   Anesthesia Plan Comments:         Anesthesia Quick Evaluation

## 2013-12-23 ENCOUNTER — Encounter (HOSPITAL_COMMUNITY): Payer: Self-pay | Admitting: *Deleted

## 2013-12-23 LAB — CBC
HEMATOCRIT: 30.9 % — AB (ref 36.0–46.0)
Hemoglobin: 10.1 g/dL — ABNORMAL LOW (ref 12.0–15.0)
MCH: 30 pg (ref 26.0–34.0)
MCHC: 32.7 g/dL (ref 30.0–36.0)
MCV: 91.7 fL (ref 78.0–100.0)
Platelets: 176 10*3/uL (ref 150–400)
RBC: 3.37 MIL/uL — AB (ref 3.87–5.11)
RDW: 15.2 % (ref 11.5–15.5)
WBC: 17.8 10*3/uL — ABNORMAL HIGH (ref 4.0–10.5)

## 2013-12-23 MED ORDER — OXYCODONE-ACETAMINOPHEN 5-325 MG PO TABS: 1.0000 | ORAL_TABLET | ORAL | Status: DC | PRN

## 2013-12-23 MED ORDER — DIBUCAINE 1 % RE OINT
1.0000 "application " | TOPICAL_OINTMENT | RECTAL | Status: DC | PRN
  Filled 2013-12-23: qty 28

## 2013-12-23 MED ORDER — LANOLIN HYDROUS EX OINT: TOPICAL_OINTMENT | CUTANEOUS | Status: DC | PRN

## 2013-12-23 MED ORDER — ZOLPIDEM TARTRATE 5 MG PO TABS: 5.0000 mg | ORAL_TABLET | Freq: Every evening | ORAL | Status: DC | PRN

## 2013-12-23 MED ORDER — WITCH HAZEL-GLYCERIN EX PADS: 1.0000 "application " | MEDICATED_PAD | CUTANEOUS | Status: DC | PRN

## 2013-12-23 MED ORDER — ONDANSETRON HCL 4 MG/2ML IJ SOLN: 4.0000 mg | INTRAMUSCULAR | Status: DC | PRN

## 2013-12-23 MED ORDER — PNEUMOCOCCAL VAC POLYVALENT 25 MCG/0.5ML IJ INJ
0.5000 mL | INJECTION | INTRAMUSCULAR | Status: DC
  Filled 2013-12-23: qty 0.5

## 2013-12-23 MED ORDER — BENZOCAINE-MENTHOL 20-0.5 % EX AERO
1.0000 "application " | INHALATION_SPRAY | CUTANEOUS | Status: DC | PRN
  Administered 2013-12-23: 1 via TOPICAL
  Filled 2013-12-23 (×2): qty 56

## 2013-12-23 MED ORDER — DIPHENHYDRAMINE HCL 25 MG PO CAPS: 25.0000 mg | ORAL_CAPSULE | Freq: Four times a day (QID) | ORAL | Status: DC | PRN

## 2013-12-23 MED ORDER — SENNOSIDES-DOCUSATE SODIUM 8.6-50 MG PO TABS
2.0000 | ORAL_TABLET | ORAL | Status: DC
  Administered 2013-12-23 – 2013-12-24 (×2): 2 via ORAL
  Filled 2013-12-23 (×2): qty 2

## 2013-12-23 MED ORDER — TETANUS-DIPHTH-ACELL PERTUSSIS 5-2.5-18.5 LF-MCG/0.5 IM SUSP
0.5000 mL | Freq: Once | INTRAMUSCULAR | Status: DC
  Filled 2013-12-23: qty 0.5

## 2013-12-23 MED ORDER — SIMETHICONE 80 MG PO CHEW: 80.0000 mg | CHEWABLE_TABLET | ORAL | Status: DC | PRN

## 2013-12-23 MED ORDER — PRENATAL MULTIVITAMIN CH
1.0000 | ORAL_TABLET | Freq: Every day | ORAL | Status: DC
  Administered 2013-12-23 – 2013-12-24 (×2): 1 via ORAL
  Filled 2013-12-23 (×2): qty 1

## 2013-12-23 MED ORDER — ONDANSETRON HCL 4 MG PO TABS: 4.0000 mg | ORAL_TABLET | ORAL | Status: DC | PRN

## 2013-12-23 MED ORDER — IBUPROFEN 600 MG PO TABS
600.0000 mg | ORAL_TABLET | Freq: Four times a day (QID) | ORAL | Status: DC
  Administered 2013-12-23 – 2013-12-24 (×6): 600 mg via ORAL
  Filled 2013-12-23 (×6): qty 1

## 2013-12-23 NOTE — Anesthesia Postprocedure Evaluation (Signed)
Anesthesia Post Note  Patient: Gus HeightBidita Rowzee  Procedure(s) Performed: * No procedures listed *  Anesthesia type: Epidural  Patient location: Mother/Baby  Post pain: Pain level controlled  Post assessment: Post-op Vital signs reviewed  Last Vitals:  Filed Vitals:   12/23/13 0730  BP: 97/59  Pulse: 75  Temp: 36.3 C  Resp: 16    Post vital signs: Reviewed  Level of consciousness:alert  Complications: No apparent anesthesia complications

## 2013-12-23 NOTE — Progress Notes (Signed)
Pt feeling tired, unable to void. RN transported pt back to bed. Pt resting, states she is too tired to walk and would like to rest.

## 2013-12-23 NOTE — Progress Notes (Signed)
I have seen and examined this patient and I agree with the above. Cam HaiSHAW, KIMBERLY CNM 1:22 AM 12/23/2013

## 2013-12-23 NOTE — Progress Notes (Signed)
I spoke with and examined patient and agree with resident/PA/SNM's note and plan of care.  Cheral MarkerKimberly R. Booker, CNM, WHNP-BC 12/23/2013 9:00 PM

## 2013-12-23 NOTE — Progress Notes (Signed)
Post Partum Day 1 Subjective: no complaints, up ad lib, voiding and tolerating PO  Objective: Blood pressure 97/59, pulse 75, temperature 97.3 F (36.3 C), temperature source Oral, resp. rate 16, height 5\' 2"  (1.575 m), weight 68.748 kg (151 lb 9 oz), last menstrual period 03/27/2013, SpO2 99.00%, unknown if currently breastfeeding.  Physical Exam:  General: alert, cooperative and appears stated age Lochia: appropriate Uterine Fundus: firm Incision: N/a  DVT Evaluation: No evidence of DVT seen on physical exam. Negative Homan's sign. No cords or calf tenderness.   Recent Labs  12/21/13 1325 12/23/13 0607  HGB 13.3 10.1*  HCT 39.2 30.9*    Assessment/Plan: Plan for discharge tomorrow, Breastfeeding, Lactation consult and Contraception undecided   LOS: 2 days   Kevin FentonBradshaw, Samuel 12/23/2013, 12:47 PM

## 2013-12-23 NOTE — Progress Notes (Signed)
Multiple attempts to visit with mother.  Will continue efforts tomorrow. 

## 2013-12-24 MED ORDER — IBUPROFEN 600 MG PO TABS: 600.0000 mg | ORAL_TABLET | Freq: Four times a day (QID) | ORAL | Status: AC

## 2013-12-24 NOTE — Discharge Summary (Signed)
Attestation of Attending Supervision of Resident: Evaluation and management procedures were performed by the [facility] under my supervision.  I have seen and examined the patient, reviewed the resident's note and chart, and I agree with the management and plan.  Anibal Hendersonarolyn L Harraway-Smith, M.D. 12/24/2013 12:49 PM

## 2013-12-24 NOTE — Discharge Instructions (Signed)
Congrats guys!  Here are the appts you can make tomorrow, our clinic number is [phone]  For baby Nurse visit for weight check in 2-3 days (tues or wed) Well child visit in 2 weeks  For you Postpartum follow up in 4-6 weeks  Vaginal Delivery Care After Refer to this sheet in the next few weeks. These discharge instructions provide you with information on caring for yourself after delivery. Your caregiver may also give you specific instructions. Your treatment has been planned according to the most current medical practices available, but problems sometimes occur. Call your caregiver if you have any problems or questions after you go home. HOME CARE INSTRUCTIONS  Take over-the-counter or prescription medicines only as directed by your caregiver or pharmacist.  Do not drink alcohol, especially if you are breastfeeding or taking medicine to relieve pain.  Do not chew or smoke tobacco.  Do not use illegal drugs.  Continue to use good perineal care. Good perineal care includes:  Wiping your perineum from front to back.  Keeping your perineum clean.  Do not use tampons or douche until your caregiver says it is okay.  Shower, wash your hair, and take tub baths as directed by your caregiver.  Wear a well-fitting bra that provides breast support.  Eat healthy foods.  Drink enough fluids to keep your urine clear or pale yellow.  Eat high-fiber foods such as whole grain cereals and breads, brown rice, beans, and fresh fruits and vegetables every day. These foods may help prevent or relieve constipation.  Follow your cargiver's recommendations regarding resumption of activities such as climbing stairs, driving, lifting, exercising, or traveling.  Talk to your caregiver about resuming sexual activities. Resumption of sexual activities is dependent upon your risk of infection, your rate of healing, and your comfort and desire to resume sexual activity.  Try to have someone help you  with your household activities and your newborn for at least a few days after you leave the hospital.  Rest as much as possible. Try to rest or take a nap when your newborn is sleeping.  Increase your activities gradually.  Keep all of your scheduled postpartum appointments. It is very important to keep your scheduled follow-up appointments. At these appointments, your caregiver will be checking to make sure that you are healing physically and emotionally. SEEK MEDICAL CARE IF:   You are passing large clots from your vagina. Save any clots to show your caregiver.  You have a foul smelling discharge from your vagina.  You have trouble urinating.  You are urinating frequently.  You have pain when you urinate.  You have a change in your bowel movements.  You have increasing redness, pain, or swelling near your vaginal incision (episiotomy) or vaginal tear.  You have pus draining from your episiotomy or vaginal tear.  Your episiotomy or vaginal tear is separating.  You have painful, hard, or reddened breasts.  You have a severe headache.  You have blurred vision or see spots.  You feel sad or depressed.  You have thoughts of hurting yourself or your newborn.  You have questions about your care, the care of your newborn, or medicines.  You are dizzy or lightheaded.  You have a rash.  You have nausea or vomiting.  You were breastfeeding and have not had a menstrual period within 12 weeks after you stopped breastfeeding.  You are not breastfeeding and have not had a menstrual period by the 12th week after delivery.  You have a  fever. SEEK IMMEDIATE MEDICAL CARE IF:   You have persistent pain.  You have chest pain.  You have shortness of breath.  You faint.  You have leg pain.  You have stomach pain.  Your vaginal bleeding saturates two or more sanitary pads in 1 hour. MAKE SURE YOU:   Understand these instructions.  Will watch your condition.  Will get  help right away if you are not doing well or get worse. Document Released: 06/12/2000 Document Revised: 03/09/2012 Document Reviewed: 02/10/2012 [facility] Patient Information 2015 [employer] [place] [place]. This information is not intended to replace advice given to you by your health care provider. Make sure you discuss any questions you have with your health care provider.

## 2013-12-24 NOTE — Progress Notes (Signed)
Clinical Social Work Department PSYCHOSOCIAL ASSESSMENT - MATERNAL/CHILD August 15, 2013  Patient:  Jacqueline Bond  Account Number:  [id number]  Admit Date:  13-Jun-2014  Ardine Eng Name:   Jacqueline Bond    Clinical Social Worker:  CUMI BEVEL, LCSW   Date/Time:  12/18/13 08:50 AM  Date Referred:  22-Sep-2013   Referral source  Central Nursery     Referred reason  Depression/Anxiety   Other referral source:    I:  FAMILY / [name] legal guardian:  PARENT  Guardian - Name Guardian - Age Guardian - Address  Husband,Jacqueline Bond [address]  [address] [place], [place] [address]  Jacqueline Bond  same as above   Other household support members/support persons Other support:    II  PSYCHOSOCIAL DATA Information Source:    Occupational hygienist Employment:   Spouse is employed   Museum/gallery curator resources:  Self Pay If [place]:   Other  [place] / Grade:   Maternity Care Coordinator / Child Services Coordination / Early Interventions:  Cultural issues impacting care:   Parents are from [place]    III  STRENGTHS Strengths  Supportive family/friends  Home prepared for Child (including basic supplies)  Adequate Resources   Strength comment:    IV  RISK FACTORS AND CURRENT PROBLEMS Current Problem:       V  SOCIAL WORK ASSESSMENT Acknowledged order for social work consult to assess mother's hx of depression.   Met with mother who was pleasant and receptive to social work intervention. Spouse was present and were attentive to mother.   This is the couple's first child.  Spouse is employed and mother will be a stay at home mom.    Mother acknowledges hx of depression.  Informed that she was treated three years ago. She reportedly had a difficult time coping with the loss of her father.  She denies any hx of taking medication and notes that eventually the symptoms resolved and she has had no recurrent symptoms.    She reports no current  symptom of depression or anxiety.     She also denies any hx of illicit drug use.   No acute social concerns noted or reported at this time.  Mother informed of social work Fish farm manager.      VI SOCIAL WORK PLAN Social Work Plan  No Further Intervention Required / No Barriers to Discharge   Type of pt/family education:   PP Depression signs/symptoms and available resource   If child protective services report - county:   If child protective services report - date:   Information/referral to community resources comment:   Referred to Patient accounts to assist with medicaid for newborn  Parents decided on Dr. Wendi Bond for well baby care

## 2013-12-24 NOTE — Discharge Summary (Signed)
Obstetric Discharge Summary Reason for Admission: Induction of labor due to cholestasis Prenatal Procedures: none Intrapartum Procedures: spontaneous vaginal delivery Postpartum Procedures: none Complications-Operative and Postpartum: second degree perineal laceration and Chorioamneitis (presumed and treated) Hemoglobin  Date Value Ref Range Status  12/23/2013 10.1* 12.0 - 15.0 g/dL Final     REPEATED TO VERIFY     DELTA CHECK NOTED  11/11/2010 13.1   Final     CAPILLARY SAMPLE     HCT  Date Value Ref Range Status  12/23/2013 30.9* 36.0 - 46.0 % Final   Jacqueline Bond had a routine induction due to cholestasis of pregnancy. She was induced with cytotec, foley bulb, and pitocin. She had presumed chorioamnieitis (febrile to 101.6 without other signs) and was treated with amp and gent X 1 right away delivering soon after. She has had an uneventful postpartum course and all of her and her husband's questions have been answered.   Physical Exam:  General: alert, cooperative and appears stated age Lochia: appropriate Uterine Fundus: firm Incision: N/a DVT Evaluation: No evidence of DVT seen on physical exam. Negative Homan's sign. No cords or calf tenderness.  Discharge Diagnoses: Term Pregnancy-delivered  Discharge Information: Date: 12/24/2013 Activity: pelvic rest Diet: routine Medications: PNV and Ibuprofen Condition: stable Instructions: refer to practice specific booklet Discharge to: home   Newborn Data: Live born female  Birth Weight: 7 lb (3175 g) APGAR: 9, 9  Home with mother.  Kevin FentonBradshaw, Samuel 12/24/2013, 12:34 PM

## 2013-12-25 NOTE — Progress Notes (Signed)
Ur chart review completed post discharge.  

## 2013-12-26 ENCOUNTER — Inpatient Hospital Stay (HOSPITAL_COMMUNITY): Admission: RE | Admit: 2013-12-26 | Payer: No Typology Code available for payment source | Source: Ambulatory Visit

## 2013-12-28 ENCOUNTER — Other Ambulatory Visit: Payer: No Typology Code available for payment source

## 2014-01-02 ENCOUNTER — Ambulatory Visit (HOSPITAL_COMMUNITY)
Admit: 2014-01-02 | Discharge: 2014-01-02 | Disposition: A | Discharge status: Home / Self Care | Payer: No Typology Code available for payment source | Attending: Family Medicine | Admitting: Family Medicine

## 2014-01-02 NOTE — Lactation Note (Signed)
Lactation Consult  Mother's reason for visit:  Sore breasts, flat nipple, latching problem Visit Type: feeding assessment Appointment Notes:  Nipple shield in hospital Consult:  Initial Lactation Consultant:  Hansel FeinsteinPowell, Kiari Hosmer Ann  ________________________________________________________________________  Joan FloresBaby's Name: Jamse ArnAbhigyan Ghoshal  Date of Birth: 12/22/2013  Pediatrician: Dr. Kevin FentonSamuel Bradshaw Gender: female  Gestational Age: 2468w4d (At Birth)  Birth Weight: 7 lb (3175 g)  Weight at Discharge: Weight: 6 lb 10.9 oz (3030 g) Date of Discharge: 12/24/2013  [facility]   12/22/13 2230 12/24/13 0012  Weight: 7 lb (3175 g) 6 lb 10.9 oz (3030 g)    Weight today: 6-13.7   ________________________________________________________________________  Mother's Name: Gus HeightBidita Venus Type of delivery:   Breastfeeding Experience:  NONE Maternal Medical Conditions: NONE Maternal Medications:  MOTRIN, STOOL SOFTENER  ________________________________________________________________________  Breastfeeding History (Post Discharge)  Frequency of breastfeeding:  EVERY 2-3 HOURS BUT NONE IN PAST 24 HOURS DUE TO SORENESS Duration of feeding:  40-60 MINUTES  Supplementation  Formula:  Volume 25 ml Frequency:  EVERY 2-3 HOURS Total volume per day:  240 ml       Brand: Gerber Goodstart  Breastmilk:  MOM IS NOT PUMPING DUE TO PAIN  Method:  Bottle,   Pumping  Type of pump:  Manual Frequency:  NONE   Infant Intake and Output Assessment  Voids:  5-6 in 24 hrs.  Color:  Clear yellow Stools:  1 in 48 hrs.  Color:  Yellow  and green  ________________________________________________________________________  Maternal Breast Assessment  Breast:  Soft Nipple:  Flat Pain level:  2-10 Pain interventions:  Comfort gels  _______________________________________________________________________ Feeding Assessment/Evaluation  Initial feeding assessment:  Infant's oral assessment:   WNL  Positioning:  Cross cradle Right breast  LATCH documentation:  Latch:  2 = Grasps breast easily, tongue down, lips flanged, rhythmical sucking.  Audible swallowing:  1 = A few with stimulation  Type of nipple:  1 = Flat  Comfort (Breast/Nipple):  1 = Filling, red/small blisters or bruises, mild/mod discomfort  Hold (Positioning):  1 = Assistance needed to correctly position infant at breast and maintain latch  LATCH score:  6  Attached assessment:  Deep  Lips flanged:  No.  Lips untucked:  Yes.    Suck assessment:  Displays both  Tools:  Nipple shield 24 mm Instructed on use and cleaning of tool:  Yes.    Pre-feed weight:  3110 g   Post-feed weight: 3130 g  Amount transferred:  20 ml Amount supplemented:  0 ml  Mom and 11 day old baby here for feeding assessment.  Mom states she has severe pain when feeding baby even with nipple shield.  Mom has not breastfed or pumped in past 24 hours to rest nipples.  Discussed supply and demand and importance of pumping when not putting baby to breast.  Mom states she can't tolerate pumping with hand pump.  [facility] and will ask for a electric pump.  Baby has gained 6 ounces in 6 days per parents.  Discussed increasing volume to 60 mls when bottle feeding.  Baby latched well with nipple shield and once bottom lip untucked mom was comfortable.  Baby had a full feeding 1 hour prior to appointment.  Follow up appointment scheduled for 01/09/14.    Total amount transferred:  20 ml Total supplement given:  0 ml

## 2014-01-09 ENCOUNTER — Ambulatory Visit (HOSPITAL_COMMUNITY): Payer: MEDICAID

## 2014-01-24 ENCOUNTER — Ambulatory Visit (INDEPENDENT_AMBULATORY_CARE_PROVIDER_SITE_OTHER): Payer: No Typology Code available for payment source | Admitting: Obstetrics and Gynecology

## 2014-01-24 ENCOUNTER — Encounter: Payer: Self-pay | Admitting: Obstetrics and Gynecology

## 2014-01-24 NOTE — Progress Notes (Signed)
  Subjective:     Jacqueline Bond is a 33 y.o. female who presents for a postpartum visit. She is 4 weeks postpartum following a spontaneous vaginal delivery. I have fully reviewed the prenatal and intrapartum course. The delivery was at 38.3 gestational weeks. Outcome: spontaneous vaginal delivery. Anesthesia: epidural. Postpartum course has been uncomplicated. Baby's course has been uncomplicated. Baby is feeding by breast. Bleeding no bleeding. Bowel function is normal. Bladder function is normal. Patient is not sexually active. Contraception method is none. Postpartum depression screening: negative.     Review of Systems A comprehensive review of systems was negative.   Objective:    There were no vitals taken for this visit.  General:  alert, cooperative and no distress   Breasts:  inspection negative, no nipple discharge or bleeding, no masses or nodularity palpable  Lungs: clear to auscultation bilaterally  Heart:  regular rate and rhythm  Abdomen: soft, non-tender; bowel sounds normal; no masses,  no organomegaly   Vulva:  normal  Vagina: normal vagina, no discharge, exudate, lesion, or erythema  Cervix:  multiparous appearance  Corpus: normal size, contour, position, consistency, mobility, non-tender  Adnexa:  no mass, fullness, tenderness  Rectal Exam: Not performed.        Assessment:     Normal postpartum exam. Pap smear not done at today's visit.   Plan:    1. Contraception: condoms 2. Patient with gestational diabetes in pregnancy- will need 2 hour glucola test 3. Follow up in: 5 months for annual exam or as needed.

## 2014-01-24 NOTE — Progress Notes (Signed)
Patient ID: Jacqueline HeightBidita Bond, female   DOB: [birth year], 33 y.o.   MRN: [id number] Pt is concerned about constipation and rectal bleeding

## 2014-04-30 ENCOUNTER — Encounter: Payer: Self-pay | Admitting: Obstetrics and Gynecology

## 2014-05-15 IMAGING — US US OB FOLLOW-UP
1 series · 12 of 28 positions shown · non-contrast
Comparison: none

[Series 1: us ob follow-up · 0.23mm/px · 12 of 42 slices shown]
[im 2/42]
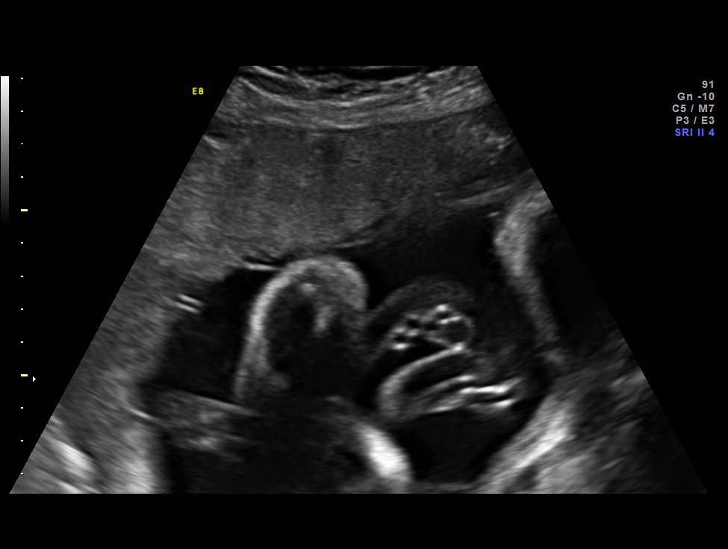
[im 5/42]
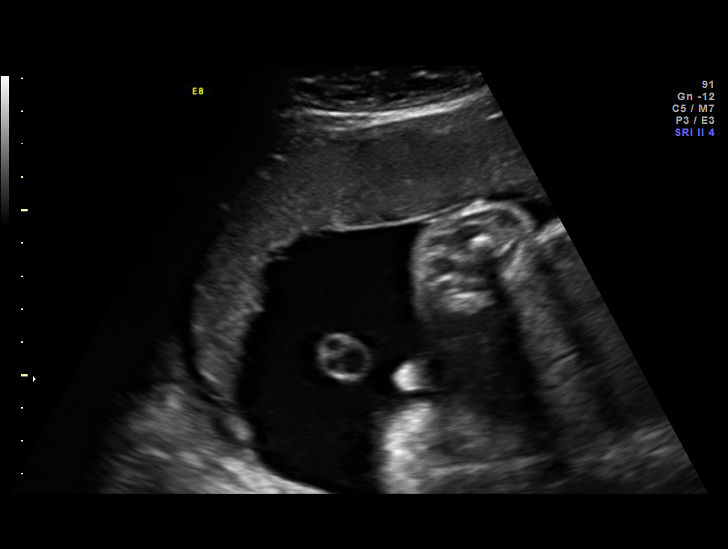
[im 8/42]
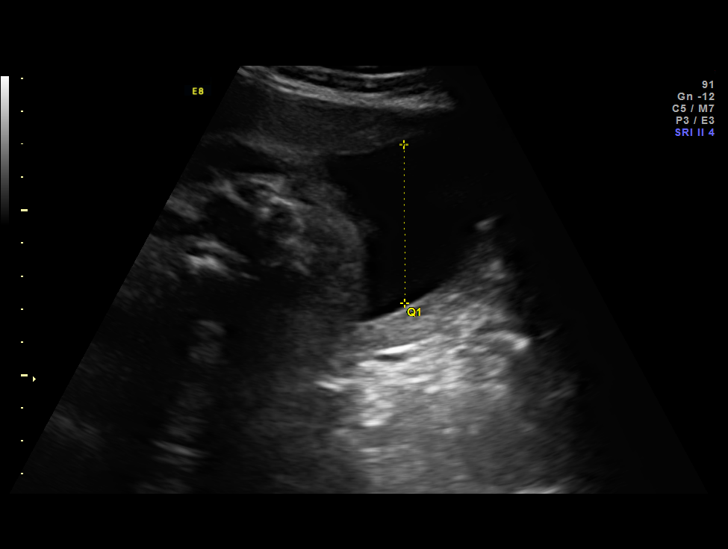
[im 13/42]
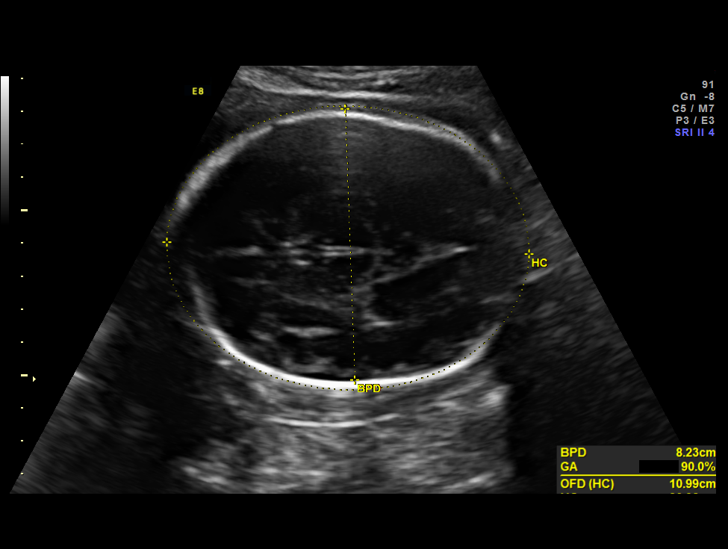
[im 16/42]
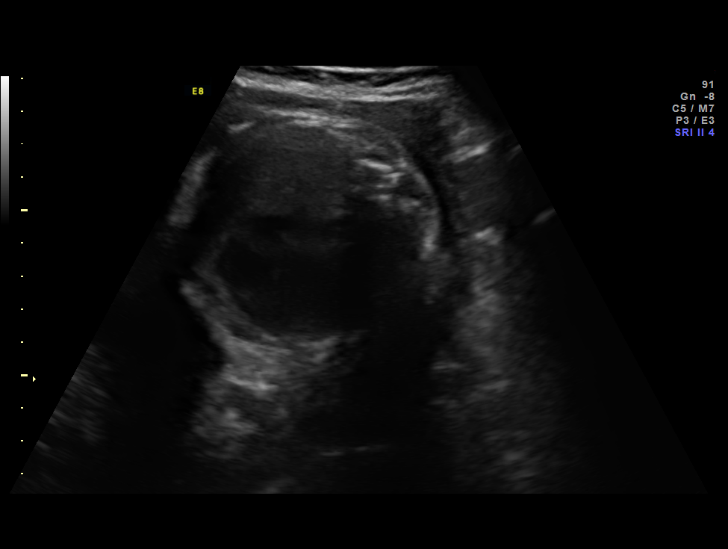
[im 19/42]
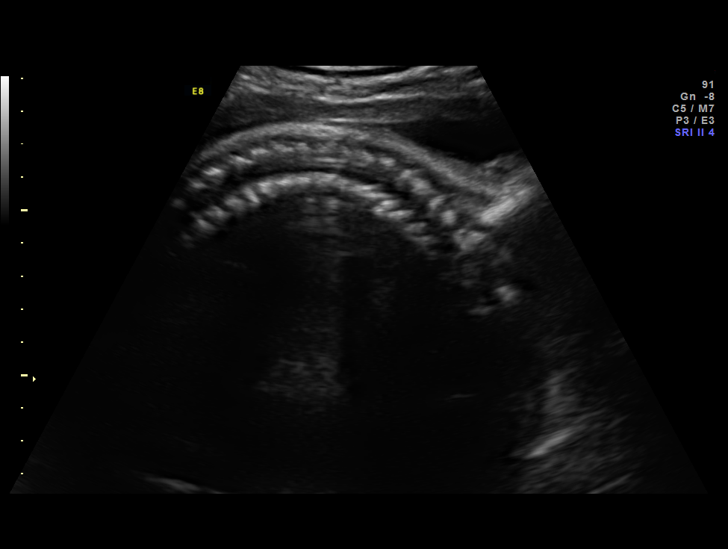
[im 23/42]
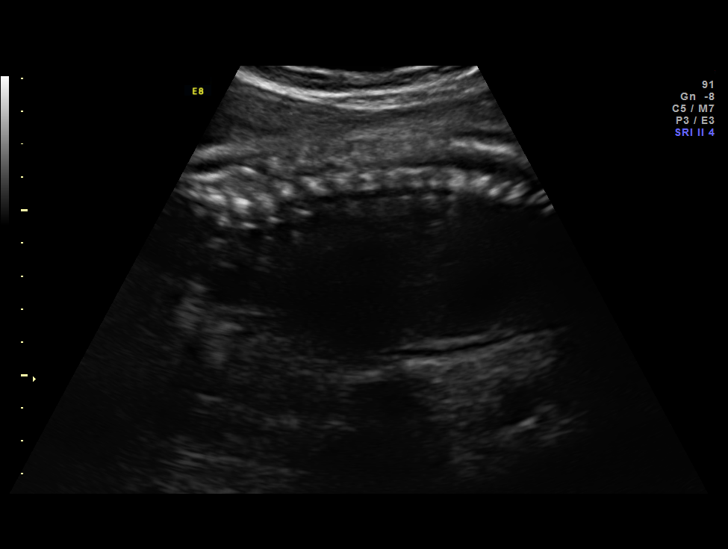
[im 26/42]
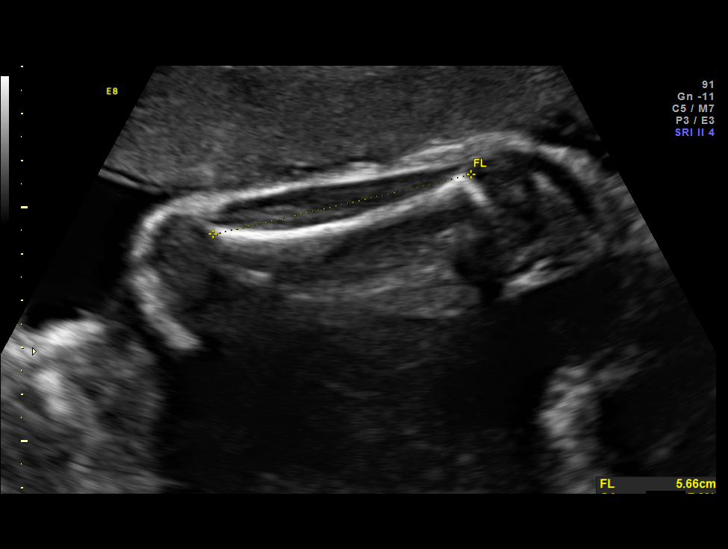
[im 29/42]
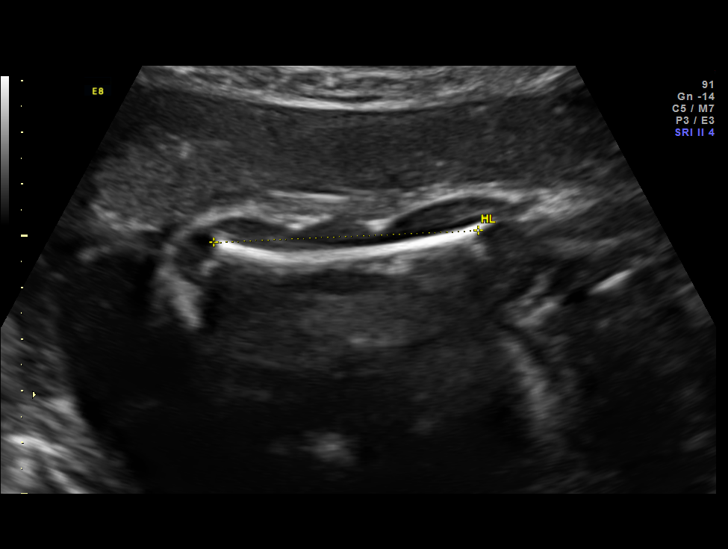
[im 34/42]
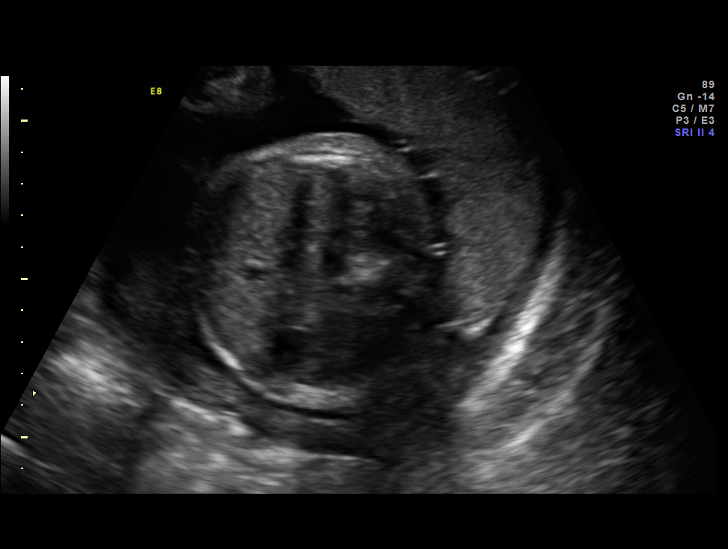
[im 37/42]
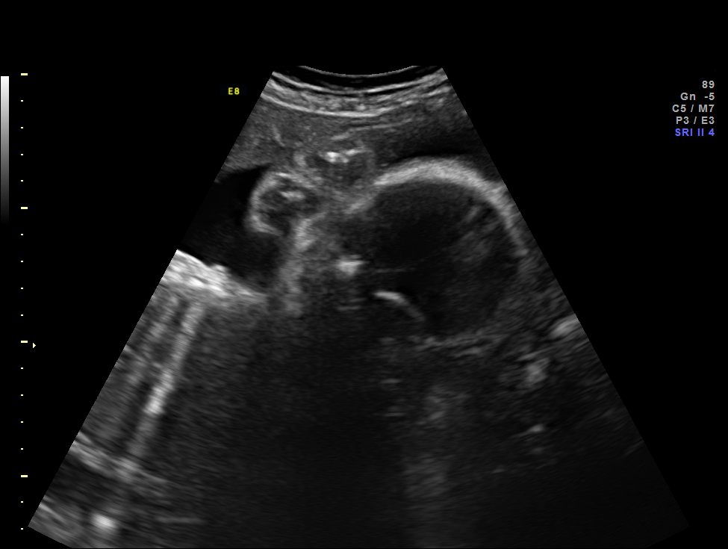
[im 40/42]
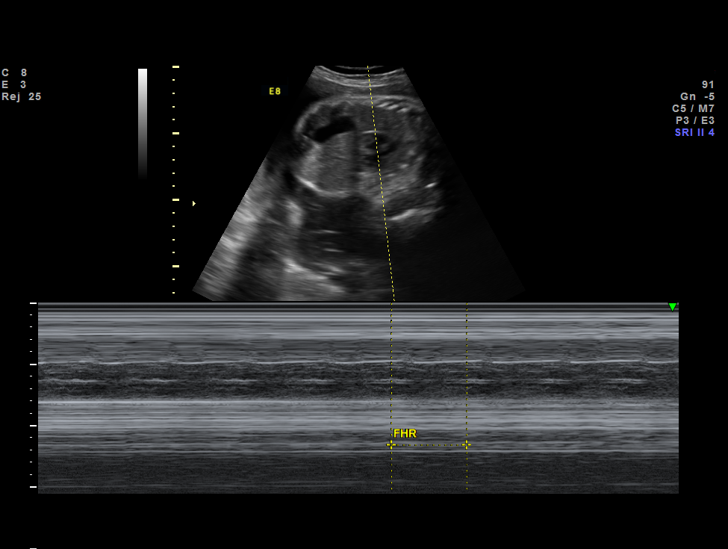

[12 of 28 positions shown; findings below may reference images not displayed]

OBSTETRICS REPORT

Service(s) Provided

 US OB FOLLOW UP                                       76816.1
Indications

 Follow-up incomplete fetal anatomic evaluation
 Diabetes - Gestational, A1 (diet controlled)
Fetal Evaluation

 Num Of Fetuses:    1
 Fetal Heart Rate:  134                          bpm
 Cardiac Activity:  Observed
 Presentation:      Cephalic
 Placenta:          Anterior, above cervical os
 P. Cord            Previously Visualized
 Insertion:

 Amniotic Fluid
 AFI FV:      Subjectively within normal limits
 AFI Sum:     17.17   cm       63  %Tile     Larg Pckt:    5.81  cm
 RUQ:   4.82    cm   RLQ:    2.67   cm    LUQ:   5.81    cm   LLQ:    3.87   cm
Biometry

 BPD:     82.3  mm     G. Age:  33w 1d                CI:         75.2   70 - 86
 OFD:    109.5  mm                                    FL/HC:      18.4   19.3 -

 HC:     308.5  mm     G. Age:  34w 3d       92  %    HC/AC:      1.11   0.96 -

 AC:     277.8  mm     G. Age:  31w 6d       67  %    FL/BPD:     68.9   71 - 87
 FL:      56.7  mm     G. Age:  29w 5d        8  %    FL/AC:      20.4   20 - 24
 HUM:       51  mm     G. Age:  29w 6d       24  %

 Est. FW:    9673  gm    3 lb 15 oz      63  %
Gestational Age

 LMP:           31w 1d        Date:  03/27/13                 EDD:   01/01/14
 U/S Today:     32w 2d                                        EDD:   12/24/13
 Best:          31w 1d     Det. By:  LMP  (03/27/13)          EDD:   01/01/14
Anatomy

 Cranium:          Previously seen        Aortic Arch:      Previously seen
 Fetal Cavum:      Previously seen        Ductal Arch:      Previously seen
 Ventricles:       Appears normal         Diaphragm:        Appears normal
 Choroid Plexus:   Previously seen        Stomach:          Appears normal, left
                                                            sided
 Cerebellum:       Previously seen        Abdomen:          Previously seen
 Posterior Fossa:  Previously seen        Abdominal Wall:   Previously seen
 Nuchal Fold:      Not applicable (>20    Cord Vessels:     Previously seen
                   wks GA)
 Face:             Orbits and profile     Kidneys:          Appear normal
                   previously seen
 Lips:             Previously seen        Bladder:          Appears normal
 Heart:            Previously seen        Spine:            Appears normal
 RVOT:             Previously seen        Lower             Previously seen
                                          Extremities:
 LVOT:             Previously seen        Upper             Previously seen
                                          Extremities:

 Other:  Fetus appears to be a male. Heels, Left 5th digit, and  Nasal bone
         previously visualized.
Cervix Uterus Adnexa

 Cervix:       Not visualized (advanced GA >36wks)
Impression

 Active SIUP at 85w5d on follow up attempt to complete fetal
 anatomy and assess interval growth due to +LOIKD
 EFW 63rd%
 No dysmorphic features, noting today's images effectively
 complete fetal survey
 No previa
Recommendations

 1. interval growth in 4 weeks (scheduled)
 2. if medication for glycemic control is ever needed, please
 arrange for antenatal testing as well.

 questions or concerns.

## 2014-06-01 ENCOUNTER — Encounter: Payer: Self-pay | Admitting: Obstetrics & Gynecology

## 2014-07-05 IMAGING — US US OB FOLLOW-UP
1 series · 13 of 28 positions shown · non-contrast
Comparison: none

[Series 1: us ob follow up · 13 of 31 slices shown]
[im 2/31]
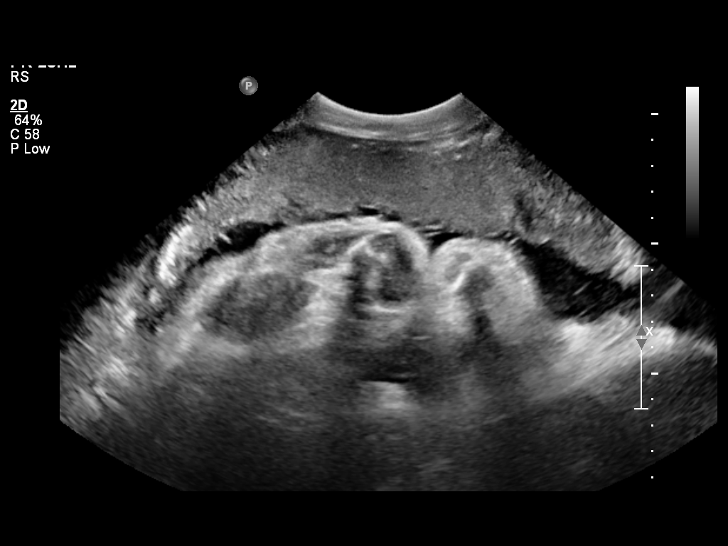
[im 4/31]
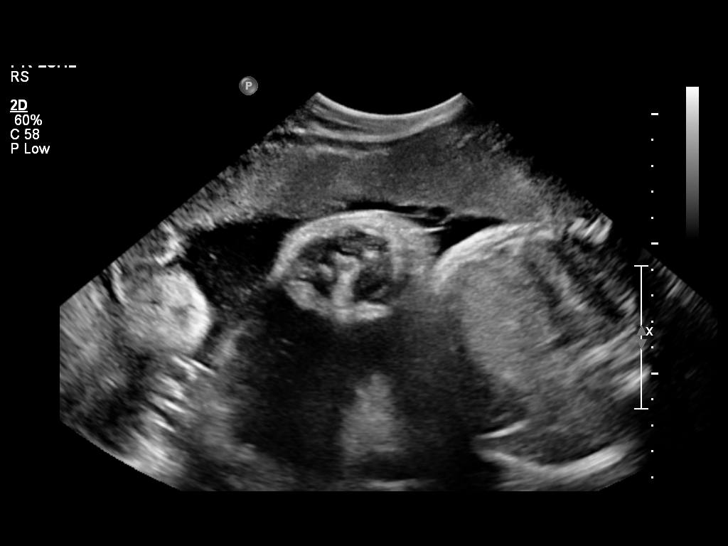
[im 6/31]
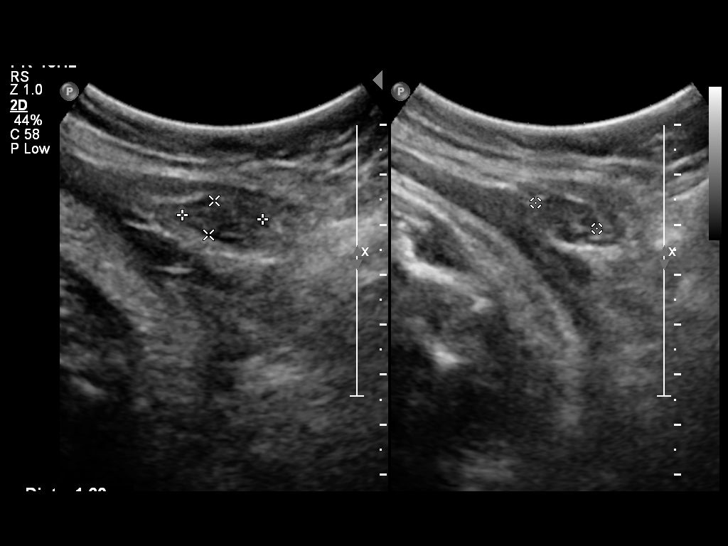
[im 8/31]
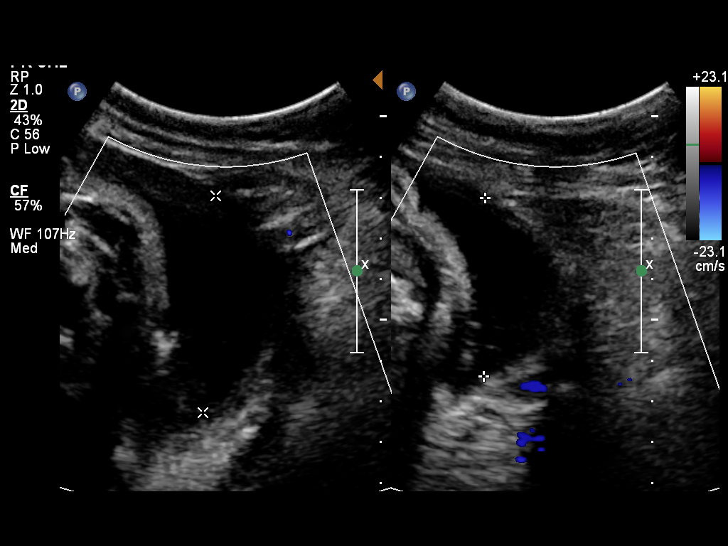
[im 11/31]
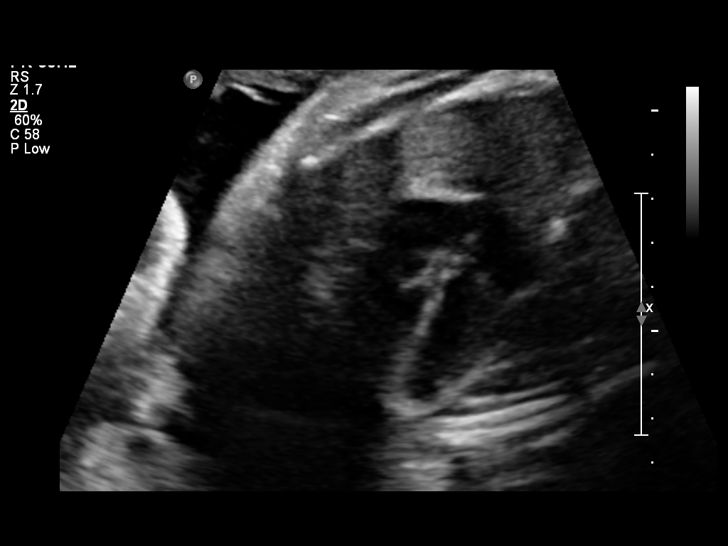
[im 13/31]
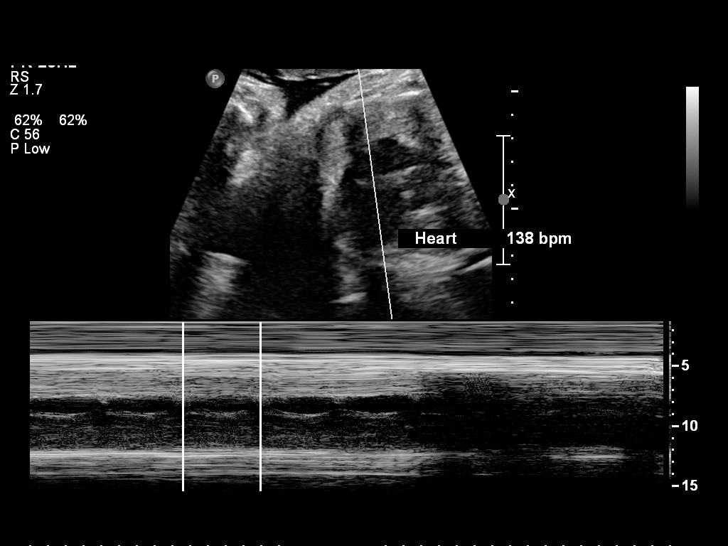
[im 16/31]
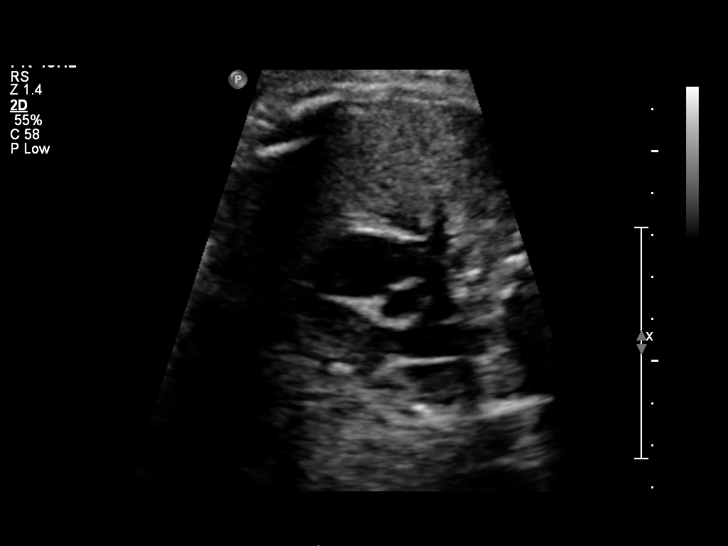
[im 18/31]
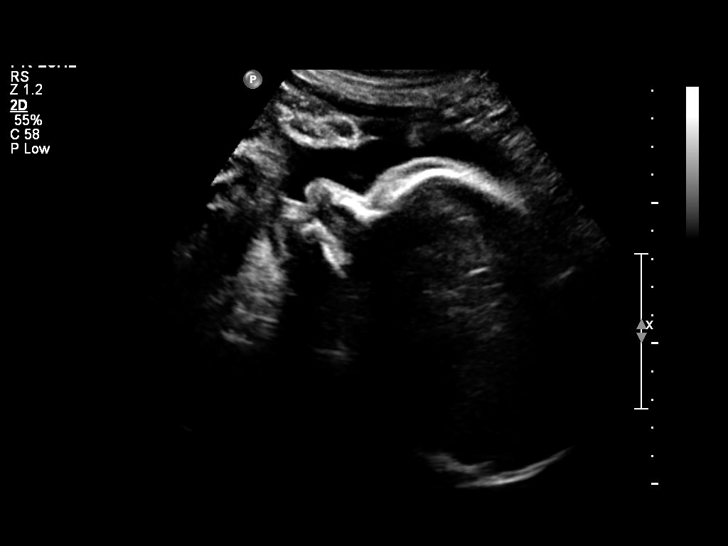
[im 21/31]
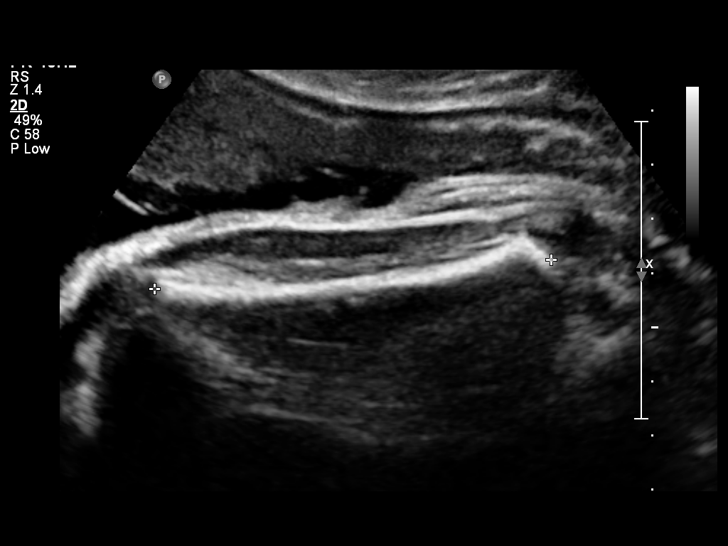
[im 23/31]
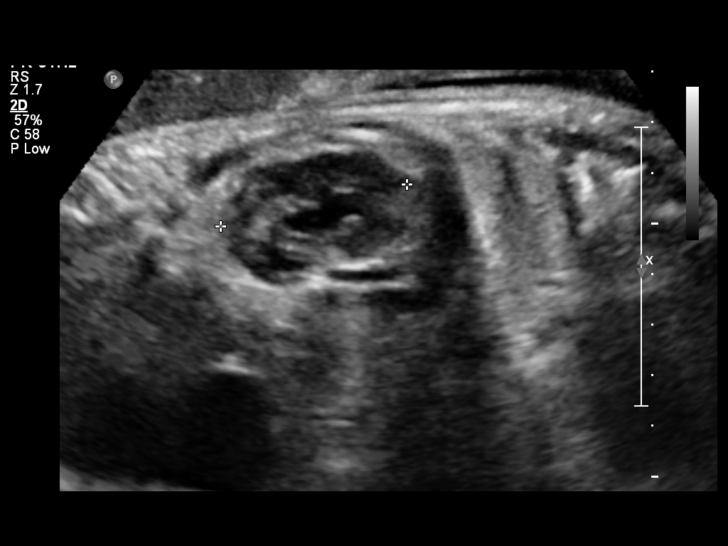
[im 25/31]
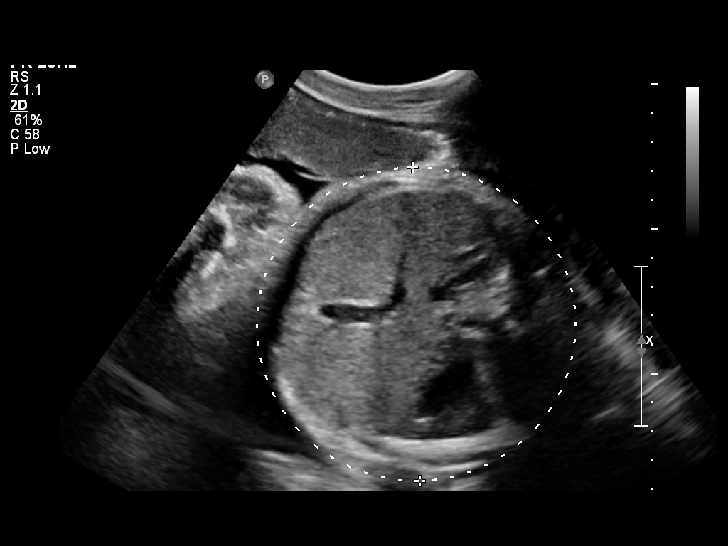
[im 27/31]
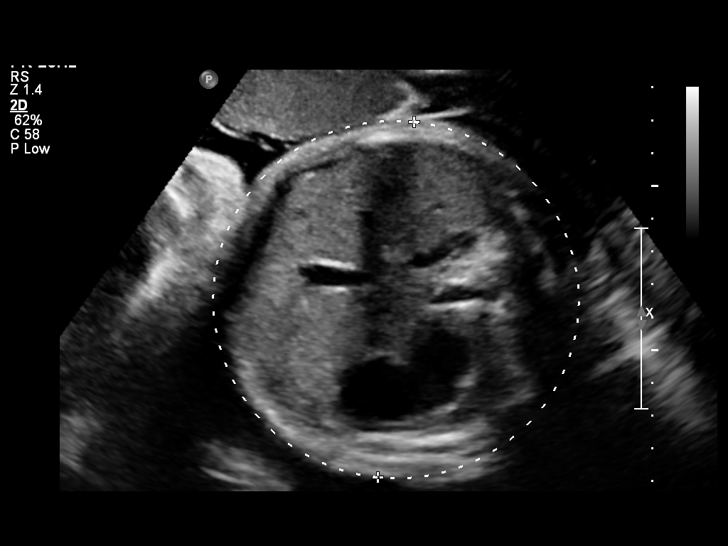
[im 29/31]
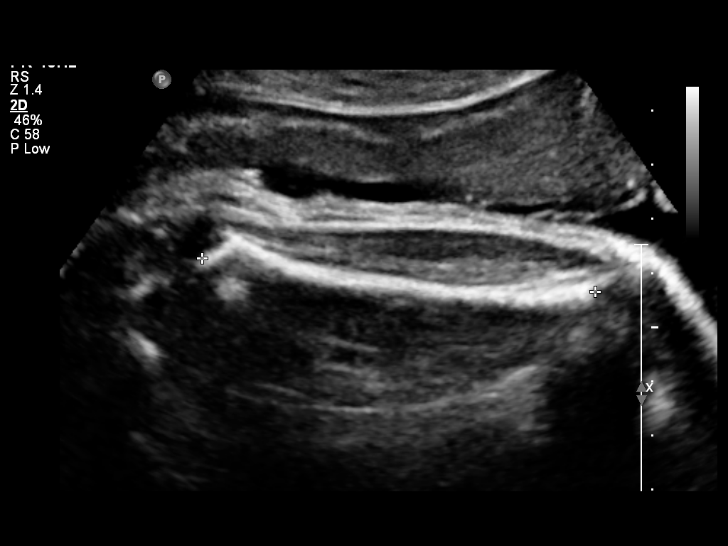

[13 of 28 positions shown; findings below may reference images not displayed]

OBSTETRICS REPORT
                      (Signed Final 12/21/2013 [DATE])

Service(s) Provided

 US OB FOLLOW UP                                       76816.1
Indications

 Diabetes - Gestational, A1 (diet controlled)
 Polyhydramnios
Fetal Evaluation

 Num Of Fetuses:    1
 Fetal Heart Rate:  138                          bpm
 Cardiac Activity:  Observed
 Presentation:      Cephalic
 Placenta:          Anterior, above cervical os
 P. Cord            Previously Visualized
 Insertion:

 Amniotic Fluid
 AFI FV:      Polyhydramnios
 AFI Sum:     23.4    cm       95  %Tile     Larg Pckt:    9.47  cm
 RUQ:   9.47    cm   RLQ:    5.3    cm    LUQ:   4.28    cm   LLQ:    4.35   cm
Biometry

 BPD:     92.2  mm     G. Age:  37w 3d                CI:        75.37   70 - 86
                                                      FL/HC:      21.6   20.9 -

 HC:     336.8  mm     G. Age:  38w 4d       38  %    HC/AC:      0.98   0.92 -

 AC:     344.4  mm     G. Age:  38w 2d       67  %    FL/BPD:     79.0   71 - 87
 FL:      72.8  mm     G. Age:  37w 2d       26  %    FL/AC:      21.1   20 - 24
 HUM:     62.6  mm     G. Age:  36w 2d       36  %

 Est. FW:    5545  gm      7 lb 7 oz     74  %
Gestational Age

 LMP:           38w 3d        Date:  03/27/13                 EDD:   01/01/14
 U/S Today:     37w 6d                                        EDD:   01/05/14
 Best:          38w 3d     Det. By:  LMP  (03/27/13)          EDD:   01/01/14
Cervix Uterus Adnexa

 Cervix:       Not visualized (advanced GA >21wks)

 Left Ovary:    Within normal limits.
 Right Ovary:   Within normal limits.
Impression

 SIUP at 38+3 weeks
 Normal interval anatomy; anatomic survey complete
 High normal amniotic fluid volume vs mild polyhydramnios
 Appropriate interval growth with EFW at the 74th %tile
Recommendations

 Continue twice weekly NSTs with weekly AFIs
 Consider delivery by 40 weeks

 questions or concerns.
